# Patient Record
Sex: Male | Born: 1940 | Race: White | Hispanic: No | Marital: Married | Smoking: Former smoker
Health system: Southern US, Community
[De-identification: ages and names within clinical notes are randomized; demographics above are authoritative.]

## PROBLEM LIST (undated history)

## (undated) DIAGNOSIS — E785 Hyperlipidemia, unspecified: Secondary | ICD-10-CM

## (undated) DIAGNOSIS — G4730 Sleep apnea, unspecified: Secondary | ICD-10-CM

## (undated) DIAGNOSIS — M1990 Unspecified osteoarthritis, unspecified site: Secondary | ICD-10-CM

## (undated) DIAGNOSIS — I5189 Other ill-defined heart diseases: Secondary | ICD-10-CM

## (undated) DIAGNOSIS — I358 Other nonrheumatic aortic valve disorders: Secondary | ICD-10-CM

## (undated) DIAGNOSIS — N200 Calculus of kidney: Secondary | ICD-10-CM

## (undated) DIAGNOSIS — J449 Chronic obstructive pulmonary disease, unspecified: Secondary | ICD-10-CM

## (undated) DIAGNOSIS — I10 Essential (primary) hypertension: Secondary | ICD-10-CM

## (undated) HISTORY — PX: HAMMER TOE SURGERY: SHX385

## (undated) HISTORY — PX: CARPAL TUNNEL RELEASE: SHX101

## (undated) HISTORY — DX: Other ill-defined heart diseases: I51.89

## (undated) HISTORY — DX: Essential (primary) hypertension: I10

## (undated) HISTORY — PX: COLONOSCOPY W/ BIOPSIES AND POLYPECTOMY: SHX1376

## (undated) HISTORY — DX: Hyperlipidemia, unspecified: E78.5

## (undated) HISTORY — PX: MULTIPLE TOOTH EXTRACTIONS: SHX2053

## (undated) HISTORY — DX: Other nonrheumatic aortic valve disorders: I35.8

## (undated) HISTORY — PX: ROTATOR CUFF REPAIR: SHX139

## (undated) HISTORY — PX: CATARACT EXTRACTION: SUR2

## (undated) HISTORY — PX: KNEE ARTHROSCOPY W/ MENISCAL REPAIR: SHX1877

## (undated) HISTORY — PX: JOINT REPLACEMENT: SHX530

## (undated) SURGERY — EGD (ESOPHAGOGASTRODUODENOSCOPY)
Anesthesia: Moderate Sedation

---

## 1992-01-13 HISTORY — PX: REPLACEMENT TOTAL KNEE: SUR1224

## 2000-02-11 ENCOUNTER — Encounter: Admission: RE | Admit: 2000-02-11 | Discharge: 2000-02-11 | Payer: Self-pay | Admitting: Orthopedic Surgery

## 2000-05-04 ENCOUNTER — Inpatient Hospital Stay (HOSPITAL_COMMUNITY): Admission: RE | Admit: 2000-05-04 | Discharge: 2000-05-05 | Payer: Self-pay | Admitting: Orthopedic Surgery

## 2001-07-25 ENCOUNTER — Encounter: Payer: Self-pay | Admitting: Orthopedic Surgery

## 2001-07-27 ENCOUNTER — Inpatient Hospital Stay (HOSPITAL_COMMUNITY): Admission: RE | Admit: 2001-07-27 | Discharge: 2001-07-30 | Payer: Self-pay | Admitting: Orthopedic Surgery

## 2006-03-29 ENCOUNTER — Ambulatory Visit (HOSPITAL_COMMUNITY): Admission: RE | Admit: 2006-03-29 | Discharge: 2006-03-29 | Payer: Self-pay | Admitting: General Surgery

## 2008-11-14 HISTORY — PX: NM MYOCAR PERF WALL MOTION: HXRAD629

## 2010-01-23 HISTORY — PX: US ECHOCARDIOGRAPHY: HXRAD669

## 2014-07-27 ENCOUNTER — Encounter: Payer: Self-pay | Admitting: *Deleted

## 2014-08-02 ENCOUNTER — Encounter: Payer: Self-pay | Admitting: Cardiovascular Disease

## 2015-01-17 DIAGNOSIS — M170 Bilateral primary osteoarthritis of knee: Secondary | ICD-10-CM | POA: Diagnosis not present

## 2015-01-22 ENCOUNTER — Ambulatory Visit: Payer: Self-pay | Admitting: Physician Assistant

## 2015-01-22 NOTE — H&P (Signed)
TOTAL KNEE ADMISSION H&P  Patient is being admitted for right total knee arthroplasty.  Subjective:  Chief Complaint:right knee pain.  HPI: Mark Solomon, 75 y.o. male, has a history of pain and functional disability in the right knee due to arthritis and has failed non-surgical conservative treatments for greater than 12 weeks to includeNSAID's and/or analgesics, corticosteriod injections and activity modification.  Onset of symptoms was gradual, starting 10 years ago with gradually worsening course since that time. The patient noted prior procedures on the knee to include  arthroscopy and menisectomy on the right knee(s).  Patient currently rates pain in the right knee(s) at 8 out of 10 with activity. Patient has night pain, worsening of pain with activity and weight bearing, pain that interferes with activities of daily living, pain with passive range of motion, crepitus and joint swelling.  Patient has evidence of periarticular osteophytes and joint space narrowing by imaging studies.There is no active infection.  There are no active problems to display for this patient.  Past Medical History  Diagnosis Date  . Hypertension   . Hyperlipidemia   . Diastolic dysfunction   . Aortic valve sclerosis     Past Surgical History  Procedure Laterality Date  . Rotator cuff repair    . Replacement total knee  1994    left  . Knee arthroscopy w/ meniscal repair      right   . US echocardiography  01/23/10    AOV appears sclerotic, borderline LA enlargement  . Nm myocar perf wall motion  11/14/2008    Normal     (Not in a hospital admission) Allergies no known allergies  Social History  Substance Use Topics  . Smoking status: Former Smoker    Quit date: 01/13/2008  . Smokeless tobacco: Never Used  . Alcohol Use: 0.0 oz/week    0 Standard drinks or equivalent per week    Family History  Problem Relation Age of Onset  . Diabetes Mother   . Heart attack Mother   . Heart attack  Father      Review of Systems  Musculoskeletal: Positive for joint pain.  All other systems reviewed and are negative.   Objective:  Physical Exam  Constitutional: He is oriented to person, place, and time. He appears well-developed and well-nourished. No distress.  HENT:  Head: Normocephalic and atraumatic.  Nose: Nose normal.  Eyes: Conjunctivae and EOM are normal. Pupils are equal, round, and reactive to light.  Neck: Normal range of motion. Neck supple.  Cardiovascular: Normal rate, regular rhythm, normal heart sounds and intact distal pulses.   Respiratory: Breath sounds normal. No respiratory distress. He has no wheezes.  GI: Soft. Bowel sounds are normal. He exhibits no distension. There is no tenderness.  Musculoskeletal:       Right knee: He exhibits swelling. He exhibits normal range of motion, no erythema, no LCL laxity and no MCL laxity. Tenderness found.  Lymphadenopathy:    He has no cervical adenopathy.  Neurological: He is alert and oriented to person, place, and time. No cranial nerve deficit.  Skin: Skin is warm and dry. No rash noted. No erythema.  Psychiatric: He has a normal mood and affect. His behavior is normal.    Vital signs in last 24 hours: @  Labs:   There is no height or weight on file to calculate BMI.   Imaging Review Plain radiographs demonstrate moderate degenerative joint disease of the right knee(s). The overall alignment issignificant varus. The bone  quality appears to be good for age and reported activity level.  Assessment/Plan:  End stage arthritis, right knee   The patient history, physical examination, clinical judgment of the provider and imaging studies are consistent with end stage degenerative joint disease of the right knee(s) and total knee arthroplasty is deemed medically necessary. The treatment options including medical management, injection therapy arthroscopy and arthroplasty were discussed at length. The risks  and benefits of total knee arthroplasty were presented and reviewed. The risks due to aseptic loosening, infection, stiffness, patella tracking problems, thromboembolic complications and other imponderables were discussed. The patient acknowledged the explanation, agreed to proceed with the plan and consent was signed. Patient is being admitted for inpatient treatment for surgery, pain control, PT, OT, prophylactic antibiotics, VTE prophylaxis, progressive ambulation and ADL's and discharge planning. The patient is planning to be discharged home with home health services

## 2015-01-28 ENCOUNTER — Encounter (HOSPITAL_COMMUNITY): Payer: Self-pay

## 2015-01-28 ENCOUNTER — Encounter (HOSPITAL_COMMUNITY)
Admission: RE | Admit: 2015-01-28 | Discharge: 2015-01-28 | Disposition: A | Discharge status: Home / Self Care | Payer: Medicare Other | Source: Ambulatory Visit | Attending: Orthopedic Surgery | Admitting: Orthopedic Surgery

## 2015-01-28 ENCOUNTER — Encounter (HOSPITAL_COMMUNITY)
Admission: RE | Admit: 2015-01-28 | Discharge: 2015-01-28 | Disposition: A | Discharge status: Home / Self Care | Payer: Medicare Other | Source: Ambulatory Visit | Attending: Physician Assistant | Admitting: Physician Assistant

## 2015-01-28 DIAGNOSIS — Z01812 Encounter for preprocedural laboratory examination: Secondary | ICD-10-CM | POA: Insufficient documentation

## 2015-01-28 DIAGNOSIS — Z01818 Encounter for other preprocedural examination: Secondary | ICD-10-CM | POA: Insufficient documentation

## 2015-01-28 DIAGNOSIS — J449 Chronic obstructive pulmonary disease, unspecified: Secondary | ICD-10-CM | POA: Insufficient documentation

## 2015-01-28 DIAGNOSIS — Z87891 Personal history of nicotine dependence: Secondary | ICD-10-CM | POA: Diagnosis not present

## 2015-01-28 DIAGNOSIS — E785 Hyperlipidemia, unspecified: Secondary | ICD-10-CM | POA: Diagnosis not present

## 2015-01-28 DIAGNOSIS — Z7982 Long term (current) use of aspirin: Secondary | ICD-10-CM | POA: Diagnosis not present

## 2015-01-28 DIAGNOSIS — M1711 Unilateral primary osteoarthritis, right knee: Secondary | ICD-10-CM

## 2015-01-28 DIAGNOSIS — Z96659 Presence of unspecified artificial knee joint: Secondary | ICD-10-CM | POA: Diagnosis not present

## 2015-01-28 DIAGNOSIS — Z471 Aftercare following joint replacement surgery: Secondary | ICD-10-CM | POA: Diagnosis not present

## 2015-01-28 DIAGNOSIS — I10 Essential (primary) hypertension: Secondary | ICD-10-CM | POA: Insufficient documentation

## 2015-01-28 DIAGNOSIS — Z79899 Other long term (current) drug therapy: Secondary | ICD-10-CM | POA: Insufficient documentation

## 2015-01-28 DIAGNOSIS — Z0183 Encounter for blood typing: Secondary | ICD-10-CM | POA: Diagnosis not present

## 2015-01-28 DIAGNOSIS — I440 Atrioventricular block, first degree: Secondary | ICD-10-CM | POA: Diagnosis not present

## 2015-01-28 HISTORY — DX: Calculus of kidney: N20.0

## 2015-01-28 HISTORY — DX: Chronic obstructive pulmonary disease, unspecified: J44.9

## 2015-01-28 HISTORY — DX: Unspecified osteoarthritis, unspecified site: M19.90

## 2015-01-28 LAB — CBC WITH DIFFERENTIAL/PLATELET
BASOS PCT: 0 %
Basophils Absolute: 0 10*3/uL (ref 0.0–0.1)
EOS ABS: 0.4 10*3/uL (ref 0.0–0.7)
EOS PCT: 6 %
HCT: 46.8 % (ref 39.0–52.0)
Hemoglobin: 16.3 g/dL (ref 13.0–17.0)
LYMPHS ABS: 1.2 10*3/uL (ref 0.7–4.0)
Lymphocytes Relative: 21 %
MCH: 33.3 pg (ref 26.0–34.0)
MCHC: 34.8 g/dL (ref 30.0–36.0)
MCV: 95.5 fL (ref 78.0–100.0)
MONO ABS: 0.4 10*3/uL (ref 0.1–1.0)
MONOS PCT: 6 %
Neutro Abs: 3.9 10*3/uL (ref 1.7–7.7)
Neutrophils Relative %: 67 %
PLATELETS: 120 10*3/uL — AB (ref 150–400)
RBC: 4.9 MIL/uL (ref 4.22–5.81)
RDW: 13.3 % (ref 11.5–15.5)
WBC: 5.9 10*3/uL (ref 4.0–10.5)

## 2015-01-28 LAB — PROTIME-INR
INR: 1.1 (ref 0.00–1.49)
Prothrombin Time: 14.4 seconds (ref 11.6–15.2)

## 2015-01-28 LAB — SURGICAL PCR SCREEN
MRSA, PCR: NEGATIVE
Staphylococcus aureus: POSITIVE — AB

## 2015-01-28 LAB — URINALYSIS, ROUTINE W REFLEX MICROSCOPIC
GLUCOSE, UA: NEGATIVE mg/dL
HGB URINE DIPSTICK: NEGATIVE
Ketones, ur: 40 mg/dL — AB
Nitrite: NEGATIVE
PROTEIN: NEGATIVE mg/dL
Specific Gravity, Urine: 1.022 (ref 1.005–1.030)
pH: 6 (ref 5.0–8.0)

## 2015-01-28 LAB — COMPREHENSIVE METABOLIC PANEL
ALBUMIN: 4.1 g/dL (ref 3.5–5.0)
ALK PHOS: 58 U/L (ref 38–126)
ALT: 24 U/L (ref 17–63)
ANION GAP: 12 (ref 5–15)
AST: 33 U/L (ref 15–41)
BUN: 14 mg/dL (ref 6–20)
CALCIUM: 9.3 mg/dL (ref 8.9–10.3)
CHLORIDE: 107 mmol/L (ref 101–111)
CO2: 25 mmol/L (ref 22–32)
Creatinine, Ser: 0.84 mg/dL (ref 0.61–1.24)
GFR calc non Af Amer: >60 mL/min (ref >60)
Glucose, Bld: 91 mg/dL (ref 65–99)
POTASSIUM: 3.7 mmol/L (ref 3.5–5.1)
SODIUM: 144 mmol/L (ref 135–145)
Total Bilirubin: 0.6 mg/dL (ref 0.3–1.2)
Total Protein: 6.5 g/dL (ref 6.5–8.1)

## 2015-01-28 LAB — TYPE AND SCREEN
ABO/RH(D): O NEG
ANTIBODY SCREEN: NEGATIVE

## 2015-01-28 LAB — APTT: aPTT: 29 seconds (ref 24–37)

## 2015-01-28 LAB — URINE MICROSCOPIC-ADD ON

## 2015-01-28 LAB — ABO/RH: ABO/RH(D): O NEG

## 2015-01-28 NOTE — Progress Notes (Signed)
Patient has had an ECHO done 01/23/10 and Stress test 11/14/08 with Dr. Tresa EndoKelly.  He does not remember why he had either of these done but states he has never had to follow-up with a cardiologist regularly and as far as he knows the tests were normal. Denies ever having a heart cath.   EKG done in PAT 01/28/15.  PCP is Selinda FlavinHOWARD, KEVIN, MD

## 2015-01-28 NOTE — Pre-Procedure Instructions (Addendum)
Mark Solomon  01/28/2015     No Pharmacies Listed   Your procedure is scheduled on Friday February 08 2015 at 1030 am.  Report to [facility] Admitting at 830 A.M.  Call this number if you have problems the morning of surgery:  [phone]   Remember:  Do not eat food or drink liquids after midnight Thursday January 26th.  Take these medicines the morning of surgery with A SIP OF WATER flonase spray (as needed), QVAR inhaler as needed (bring with you), tamsulosin (flomax) if needed  STOP: ALL Vitamins, Supplements, Effient and Herbal Medications, Fish Oils, Aspirins, NSAIDs (Nonsteroidal Anti-inflammatories such as Ibuprofen, Aleve, or Advil), and Goody's/BC Powders 7 days prior to surgery, until after surgery as directed by your physician.    Do not wear jewelry, make-up or nail polish.  Do not wear lotions, powders, or perfumes.  You may wear deodorant.  Do not shave 48 hours prior to surgery.  Men may shave face and neck.  Do not bring valuables to the hospital.  [facility] is not responsible for any belongings or valuables.  Contacts, dentures or bridgework may not be worn into surgery.  Leave your suitcase in the car.  After surgery it may be brought to your room.  For patients admitted to the hospital, discharge time will be determined by your treatment team.  Patients discharged the day of surgery will not be allowed to drive home.    Please read over the following fact sheets that you were given. Pain Booklet, Coughing and Deep Breathing, Blood Transfusion Information, Total Joint Packet, MRSA Information and Surgical Site Infection Prevention        Preparing for Surgery at [facility]  Before surgery, you can play an important role.  Because skin is not sterile, your skin needs to be as free of germs as possible.  You can reduce the number of germs on your skin by washing with CHG (chlorahexidine gluconate) Soap before surgery.  CHG is an antiseptic  cleaner with kills germs and bonds with the skin to continue killing germs even after washing.   Please do not use if you have an allergy to CHG or antibacterial soaps.  If your skin becomes reddened/irritated stop using the CHG.  Do not shave (including legs and underarms) for at least 48 hours prior to first CHG shower.  It is okay to shave your face.  Please follow these instructions carefully:  1. Shower with CHG Soap the night before surgery and the morning of Surgery. 2. If you choose to wash your hair, wash your hair first as usual with your normal shampoo. 3. After you shampoo, rinse your hair and body thoroughly to remove the Shampoo. 4. Use CHG as you would any other liquid soap. You can apply chg directly to the skin and wash gently with scrungie or a clean washcloth. 5. Apply the CHG Soap to your body ONLY FROM THE NECK DOWN. Do not use on open wounds or open sores. Avoid contact with your eyes, ears, mouth and genitals (private parts). Wash genitals (private parts) with your normal soap. 6. Wash thoroughly, paying special attention to the area where your surgery will be performed. 7. Thoroughly rinse your body with warm water from the neck down. 8. DO NOT shower/wash with your normal soap after using and rinsing off the CHG Soap. 9. Pat yourself dry with a clean towel.  10. Wear clean pajamas.  11. Place clean sheets on  your bed the night of your first shower and do not sleep with pets.  Day of Surgery  Do not apply any lotions/deodorants the morning of surgery. Please wear clean clothes to the hospital/surgery center.

## 2015-01-29 LAB — URINE CULTURE

## 2015-01-29 NOTE — Progress Notes (Signed)
Anesthesia Chart Review:  Pt is 75 year old male scheduled for R total knee arthroplasty on 02/08/2015 with Dr. Madelon Lips.   PMH includes:  CAD (I find no evidence of this diagnosis), HTN, hyperlipidemia, COPD. Former smoker. BMI 30.   Medications include: ASA, qvar, vytorin, losartan.   Preoperative labs reviewed.    Chest x-ray 01/28/15 reviewed. No active cardiopulmonary disease.   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 Grade I diastolic dysfunction.  - Mild mitral annular calcification. Mild MR. No MVP.  - Trace TR - Aortic valve appears to be mildly sclerotic. No aortic stenosis.  - Borderline LAE  Nuclear stress test 11/14/08:  - Normal myocardial perfusion imaging. EF 85%. No significant ischemia demonstrated. This is a low risk scan.   If no changes, I anticipate pt can proceed with surgery as scheduled.   Rica Mast, FNP-BC [facility]/Anesthesiology Phone: [phone] 01/29/2015 3:33 PM

## 2015-01-31 ENCOUNTER — Encounter (HOSPITAL_COMMUNITY): Payer: Self-pay

## 2015-02-07 DIAGNOSIS — Z96651 Presence of right artificial knee joint: Secondary | ICD-10-CM | POA: Diagnosis not present

## 2015-02-07 DIAGNOSIS — M1711 Unilateral primary osteoarthritis, right knee: Secondary | ICD-10-CM | POA: Diagnosis not present

## 2015-02-07 MED ORDER — SODIUM CHLORIDE 0.9 % IV SOLN: INTRAVENOUS | Status: DC

## 2015-02-07 MED ORDER — CHLORHEXIDINE GLUCONATE 4 % EX LIQD: 60.0000 mL | Freq: Once | CUTANEOUS | Status: DC

## 2015-02-07 MED ORDER — CEFAZOLIN SODIUM-DEXTROSE 2-3 GM-% IV SOLR
2.0000 g | INTRAVENOUS | Status: AC
  Administered 2015-02-08: 2 g via INTRAVENOUS
  Filled 2015-02-07: qty 50

## 2015-02-07 MED ORDER — SODIUM CHLORIDE 0.9 % IV SOLN
1000.0000 mg | INTRAVENOUS | Status: DC
  Filled 2015-02-07: qty 10

## 2015-02-08 ENCOUNTER — Inpatient Hospital Stay (HOSPITAL_COMMUNITY)
Admission: RE | Admit: 2015-02-08 | Discharge: 2015-02-11 | DRG: 470 | Disposition: A | Discharge status: Home Health | Payer: Medicare Other | Source: Ambulatory Visit | Attending: Orthopedic Surgery | Admitting: Orthopedic Surgery

## 2015-02-08 ENCOUNTER — Encounter (HOSPITAL_COMMUNITY)
Admission: RE | Disposition: A | Discharge status: Home Health | Payer: Self-pay | Source: Ambulatory Visit | Attending: Orthopedic Surgery

## 2015-02-08 ENCOUNTER — Inpatient Hospital Stay (HOSPITAL_COMMUNITY): Payer: Medicare Other | Admitting: Anesthesiology

## 2015-02-08 ENCOUNTER — Encounter (HOSPITAL_COMMUNITY): Payer: Self-pay | Admitting: *Deleted

## 2015-02-08 ENCOUNTER — Inpatient Hospital Stay (HOSPITAL_COMMUNITY): Payer: Medicare Other | Admitting: Emergency Medicine

## 2015-02-08 DIAGNOSIS — Z79899 Other long term (current) drug therapy: Secondary | ICD-10-CM

## 2015-02-08 DIAGNOSIS — I10 Essential (primary) hypertension: Secondary | ICD-10-CM | POA: Diagnosis not present

## 2015-02-08 DIAGNOSIS — J449 Chronic obstructive pulmonary disease, unspecified: Secondary | ICD-10-CM | POA: Diagnosis not present

## 2015-02-08 DIAGNOSIS — E785 Hyperlipidemia, unspecified: Secondary | ICD-10-CM | POA: Diagnosis present

## 2015-02-08 DIAGNOSIS — D62 Acute posthemorrhagic anemia: Secondary | ICD-10-CM | POA: Diagnosis not present

## 2015-02-08 DIAGNOSIS — Z87891 Personal history of nicotine dependence: Secondary | ICD-10-CM

## 2015-02-08 DIAGNOSIS — M179 Osteoarthritis of knee, unspecified: Secondary | ICD-10-CM | POA: Diagnosis not present

## 2015-02-08 DIAGNOSIS — G8918 Other acute postprocedural pain: Secondary | ICD-10-CM | POA: Diagnosis not present

## 2015-02-08 DIAGNOSIS — M1711 Unilateral primary osteoarthritis, right knee: Secondary | ICD-10-CM | POA: Diagnosis not present

## 2015-02-08 DIAGNOSIS — M25561 Pain in right knee: Secondary | ICD-10-CM | POA: Diagnosis present

## 2015-02-08 DIAGNOSIS — Z7901 Long term (current) use of anticoagulants: Secondary | ICD-10-CM | POA: Diagnosis not present

## 2015-02-08 DIAGNOSIS — I358 Other nonrheumatic aortic valve disorders: Secondary | ICD-10-CM | POA: Diagnosis not present

## 2015-02-08 HISTORY — PX: TOTAL KNEE ARTHROPLASTY: SHX125

## 2015-02-08 LAB — CBC
HEMATOCRIT: 41.4 % (ref 39.0–52.0)
Hemoglobin: 14 g/dL (ref 13.0–17.0)
MCH: 32.3 pg (ref 26.0–34.0)
MCHC: 33.8 g/dL (ref 30.0–36.0)
MCV: 95.6 fL (ref 78.0–100.0)
Platelets: 121 10*3/uL — ABNORMAL LOW (ref 150–400)
RBC: 4.33 MIL/uL (ref 4.22–5.81)
RDW: 13.3 % (ref 11.5–15.5)
WBC: 7.3 10*3/uL (ref 4.0–10.5)

## 2015-02-08 LAB — CREATININE, SERUM: Creatinine, Ser: 0.79 mg/dL (ref 0.61–1.24)

## 2015-02-08 SURGERY — ARTHROPLASTY, KNEE, TOTAL
Anesthesia: Regional | Site: Knee | Laterality: Right

## 2015-02-08 MED ORDER — EZETIMIBE-SIMVASTATIN 10-40 MG PO TABS
1.0000 | ORAL_TABLET | Freq: Every day | ORAL | Status: DC
  Administered 2015-02-08 – 2015-02-10 (×3): 1 via ORAL
  Filled 2015-02-08 (×5): qty 1

## 2015-02-08 MED ORDER — OXYCODONE-ACETAMINOPHEN 5-325 MG PO TABS: 1.0000 | ORAL_TABLET | ORAL | Status: DC | PRN

## 2015-02-08 MED ORDER — ROCURONIUM BROMIDE 50 MG/5ML IV SOLN
INTRAVENOUS | Status: AC
  Filled 2015-02-08: qty 1

## 2015-02-08 MED ORDER — FENTANYL CITRATE (PF) 100 MCG/2ML IJ SOLN
INTRAMUSCULAR | Status: DC | PRN
  Administered 2015-02-08 (×5): 50 ug via INTRAVENOUS

## 2015-02-08 MED ORDER — ALUM & MAG HYDROXIDE-SIMETH 200-200-20 MG/5ML PO SUSP: 30.0000 mL | ORAL | Status: DC | PRN

## 2015-02-08 MED ORDER — PROMETHAZINE HCL 25 MG/ML IJ SOLN
6.2500 mg | INTRAMUSCULAR | Status: DC | PRN
Start: 2015-02-08 — End: 2015-02-08

## 2015-02-08 MED ORDER — OXYCODONE HCL 5 MG PO TABS
5.0000 mg | ORAL_TABLET | ORAL | Status: DC | PRN
  Administered 2015-02-08 – 2015-02-10 (×7): 10 mg via ORAL
  Filled 2015-02-08 (×7): qty 2

## 2015-02-08 MED ORDER — POLYETHYLENE GLYCOL 3350 17 G PO PACK: 17.0000 g | PACK | Freq: Every day | ORAL | Status: DC | PRN

## 2015-02-08 MED ORDER — BUPIVACAINE-EPINEPHRINE (PF) 0.5% -1:200000 IJ SOLN
INTRAMUSCULAR | Status: DC | PRN
  Administered 2015-02-08: 30 mL via PERINEURAL

## 2015-02-08 MED ORDER — METOCLOPRAMIDE HCL 5 MG/ML IJ SOLN: 5.0000 mg | Freq: Three times a day (TID) | INTRAMUSCULAR | Status: DC | PRN

## 2015-02-08 MED ORDER — PROPOFOL 10 MG/ML IV BOLUS
INTRAVENOUS | Status: AC
  Filled 2015-02-08: qty 20

## 2015-02-08 MED ORDER — MEPERIDINE HCL 25 MG/ML IJ SOLN: 6.2500 mg | INTRAMUSCULAR | Status: DC | PRN

## 2015-02-08 MED ORDER — DOCUSATE SODIUM 100 MG PO CAPS
100.0000 mg | ORAL_CAPSULE | Freq: Two times a day (BID) | ORAL | Status: DC
  Administered 2015-02-08 – 2015-02-11 (×6): 100 mg via ORAL
  Filled 2015-02-08 (×6): qty 1

## 2015-02-08 MED ORDER — ONDANSETRON HCL 4 MG PO TABS: 4.0000 mg | ORAL_TABLET | Freq: Four times a day (QID) | ORAL | Status: DC | PRN

## 2015-02-08 MED ORDER — LIDOCAINE HCL (CARDIAC) 20 MG/ML IV SOLN
INTRAVENOUS | Status: DC | PRN
  Administered 2015-02-08: 100 mg via INTRAVENOUS

## 2015-02-08 MED ORDER — MIDAZOLAM HCL 2 MG/2ML IJ SOLN
INTRAMUSCULAR | Status: AC
  Filled 2015-02-08: qty 2

## 2015-02-08 MED ORDER — PROPOFOL 10 MG/ML IV BOLUS: INTRAVENOUS | Status: DC | PRN

## 2015-02-08 MED ORDER — ENOXAPARIN SODIUM 30 MG/0.3ML ~~LOC~~ SOLN: 30.0000 mg | Freq: Two times a day (BID) | SUBCUTANEOUS | Status: DC

## 2015-02-08 MED ORDER — ONDANSETRON HCL 4 MG/2ML IJ SOLN
INTRAMUSCULAR | Status: AC
  Filled 2015-02-08: qty 2

## 2015-02-08 MED ORDER — APIXABAN 2.5 MG PO TABS
2.5000 mg | ORAL_TABLET | Freq: Two times a day (BID) | ORAL | Status: DC
  Administered 2015-02-09 – 2015-02-11 (×5): 2.5 mg via ORAL
  Filled 2015-02-08 (×5): qty 1

## 2015-02-08 MED ORDER — LACTATED RINGERS IV SOLN
INTRAVENOUS | Status: DC
  Administered 2015-02-08 (×3): via INTRAVENOUS

## 2015-02-08 MED ORDER — METOCLOPRAMIDE HCL 5 MG PO TABS: 5.0000 mg | ORAL_TABLET | Freq: Three times a day (TID) | ORAL | Status: DC | PRN

## 2015-02-08 MED ORDER — ONDANSETRON HCL 4 MG/2ML IJ SOLN
INTRAMUSCULAR | Status: DC | PRN
  Administered 2015-02-08: 4 mg via INTRAVENOUS

## 2015-02-08 MED ORDER — SODIUM CHLORIDE 0.9 % IJ SOLN
INTRAMUSCULAR | Status: AC
  Filled 2015-02-08: qty 10

## 2015-02-08 MED ORDER — SODIUM CHLORIDE 0.9 % IR SOLN
Status: DC | PRN
  Administered 2015-02-08: 1000 mL

## 2015-02-08 MED ORDER — EPHEDRINE SULFATE 50 MG/ML IJ SOLN
INTRAMUSCULAR | Status: DC | PRN
  Administered 2015-02-08: 10 mg via INTRAVENOUS
  Administered 2015-02-08 (×2): 5 mg via INTRAVENOUS
  Administered 2015-02-08: 20 mg via INTRAVENOUS
  Administered 2015-02-08: 5 mg via INTRAVENOUS

## 2015-02-08 MED ORDER — FENTANYL CITRATE (PF) 100 MCG/2ML IJ SOLN
INTRAMUSCULAR | Status: AC
  Filled 2015-02-08: qty 2

## 2015-02-08 MED ORDER — FENTANYL CITRATE (PF) 250 MCG/5ML IJ SOLN
INTRAMUSCULAR | Status: AC
  Filled 2015-02-08: qty 5

## 2015-02-08 MED ORDER — PROPOFOL 10 MG/ML IV BOLUS
INTRAVENOUS | Status: DC | PRN
  Administered 2015-02-08: 200 mg via INTRAVENOUS

## 2015-02-08 MED ORDER — HYDROMORPHONE HCL 1 MG/ML IJ SOLN
1.0000 mg | INTRAMUSCULAR | Status: DC | PRN
  Administered 2015-02-08 – 2015-02-10 (×6): 1 mg via INTRAVENOUS
  Filled 2015-02-08 (×6): qty 1

## 2015-02-08 MED ORDER — SODIUM CHLORIDE 0.9 % IV SOLN
INTRAVENOUS | Status: DC
  Administered 2015-02-08: 18:00:00 via INTRAVENOUS

## 2015-02-08 MED ORDER — CEFAZOLIN SODIUM 1-5 GM-% IV SOLN
1.0000 g | Freq: Four times a day (QID) | INTRAVENOUS | Status: AC
  Administered 2015-02-08 – 2015-02-09 (×2): 1 g via INTRAVENOUS
  Filled 2015-02-08 (×2): qty 50

## 2015-02-08 MED ORDER — PHENYLEPHRINE HCL 10 MG/ML IJ SOLN
INTRAMUSCULAR | Status: DC | PRN
  Administered 2015-02-08 (×4): 40 ug via INTRAVENOUS
  Administered 2015-02-08: 80 ug via INTRAVENOUS
  Administered 2015-02-08: 40 ug via INTRAVENOUS
  Administered 2015-02-08: 120 ug via INTRAVENOUS

## 2015-02-08 MED ORDER — ARTIFICIAL TEARS OP OINT
TOPICAL_OINTMENT | OPHTHALMIC | Status: DC | PRN
  Administered 2015-02-08: 1 via OPHTHALMIC

## 2015-02-08 MED ORDER — LOSARTAN POTASSIUM 50 MG PO TABS
50.0000 mg | ORAL_TABLET | Freq: Every day | ORAL | Status: DC
  Administered 2015-02-08 – 2015-02-11 (×4): 50 mg via ORAL
  Filled 2015-02-08 (×4): qty 1

## 2015-02-08 MED ORDER — MIDAZOLAM HCL 2 MG/2ML IJ SOLN
1.0000 mg | Freq: Once | INTRAMUSCULAR | Status: AC
  Administered 2015-02-08: 1 mg via INTRAVENOUS

## 2015-02-08 MED ORDER — HYDROMORPHONE HCL 1 MG/ML IJ SOLN
INTRAMUSCULAR | Status: AC
  Filled 2015-02-08: qty 1

## 2015-02-08 MED ORDER — FENTANYL CITRATE (PF) 100 MCG/2ML IJ SOLN
50.0000 ug | Freq: Once | INTRAMUSCULAR | Status: AC
  Administered 2015-02-08: 50 ug via INTRAVENOUS

## 2015-02-08 MED ORDER — GLYCOPYRROLATE 0.2 MG/ML IJ SOLN
INTRAMUSCULAR | Status: DC | PRN
  Administered 2015-02-08: .6 mg via INTRAVENOUS

## 2015-02-08 MED ORDER — ADULT MULTIVITAMIN W/MINERALS CH
1.0000 | ORAL_TABLET | Freq: Every day | ORAL | Status: DC
  Administered 2015-02-09 – 2015-02-11 (×3): 1 via ORAL
  Filled 2015-02-08 (×3): qty 1

## 2015-02-08 MED ORDER — PHENOL 1.4 % MT LIQD: 1.0000 | OROMUCOSAL | Status: DC | PRN

## 2015-02-08 MED ORDER — FLEET ENEMA 7-19 GM/118ML RE ENEM
1.0000 | ENEMA | Freq: Once | RECTAL | Status: DC | PRN
Start: 2015-02-08 — End: 2015-02-11

## 2015-02-08 MED ORDER — EPHEDRINE SULFATE 50 MG/ML IJ SOLN
INTRAMUSCULAR | Status: AC
  Filled 2015-02-08: qty 1

## 2015-02-08 MED ORDER — ACETAMINOPHEN 650 MG RE SUPP: 650.0000 mg | Freq: Four times a day (QID) | RECTAL | Status: DC | PRN

## 2015-02-08 MED ORDER — TAMSULOSIN HCL 0.4 MG PO CAPS
0.4000 mg | ORAL_CAPSULE | Freq: Every day | ORAL | Status: DC
  Administered 2015-02-08 – 2015-02-11 (×4): 0.4 mg via ORAL
  Filled 2015-02-08 (×4): qty 1

## 2015-02-08 MED ORDER — ARTIFICIAL TEARS OP OINT
TOPICAL_OINTMENT | OPHTHALMIC | Status: AC
  Filled 2015-02-08: qty 3.5

## 2015-02-08 MED ORDER — APIXABAN 2.5 MG PO TABS: 2.5000 mg | ORAL_TABLET | Freq: Two times a day (BID) | ORAL | Status: DC

## 2015-02-08 MED ORDER — LIDOCAINE HCL (CARDIAC) 20 MG/ML IV SOLN
INTRAVENOUS | Status: AC
  Filled 2015-02-08: qty 5

## 2015-02-08 MED ORDER — BUDESONIDE 0.25 MG/2ML IN SUSP: 0.2500 mg | Freq: Every day | RESPIRATORY_TRACT | Status: DC | PRN

## 2015-02-08 MED ORDER — DIPHENHYDRAMINE HCL 12.5 MG/5ML PO ELIX: 12.5000 mg | ORAL_SOLUTION | ORAL | Status: DC | PRN

## 2015-02-08 MED ORDER — ACETAMINOPHEN 325 MG PO TABS: 650.0000 mg | ORAL_TABLET | Freq: Four times a day (QID) | ORAL | Status: DC | PRN

## 2015-02-08 MED ORDER — SUCCINYLCHOLINE CHLORIDE 20 MG/ML IJ SOLN
INTRAMUSCULAR | Status: DC | PRN
  Administered 2015-02-08: 100 mg via INTRAVENOUS

## 2015-02-08 MED ORDER — GLYCOPYRROLATE 0.2 MG/ML IJ SOLN
INTRAMUSCULAR | Status: AC
  Filled 2015-02-08: qty 1

## 2015-02-08 MED ORDER — ONDANSETRON HCL 4 MG/2ML IJ SOLN: 4.0000 mg | Freq: Four times a day (QID) | INTRAMUSCULAR | Status: DC | PRN

## 2015-02-08 MED ORDER — MENTHOL 3 MG MT LOZG: 1.0000 | LOZENGE | OROMUCOSAL | Status: DC | PRN

## 2015-02-08 MED ORDER — SORBITOL 70 % SOLN
30.0000 mL | Freq: Every day | Status: DC | PRN
Start: 2015-02-08 — End: 2015-02-11

## 2015-02-08 MED ORDER — ROCURONIUM BROMIDE 100 MG/10ML IV SOLN
INTRAVENOUS | Status: DC | PRN
  Administered 2015-02-08: 50 mg via INTRAVENOUS

## 2015-02-08 MED ORDER — SUCCINYLCHOLINE CHLORIDE 20 MG/ML IJ SOLN
INTRAMUSCULAR | Status: AC
  Filled 2015-02-08: qty 1

## 2015-02-08 MED ORDER — HYDROMORPHONE HCL 1 MG/ML IJ SOLN
0.2500 mg | INTRAMUSCULAR | Status: DC | PRN
  Administered 2015-02-08: 0.5 mg via INTRAVENOUS

## 2015-02-08 MED ORDER — NEOSTIGMINE METHYLSULFATE 10 MG/10ML IV SOLN
INTRAVENOUS | Status: DC | PRN
  Administered 2015-02-08: 4 mg via INTRAVENOUS

## 2015-02-08 MED ORDER — FLUTICASONE PROPIONATE 50 MCG/ACT NA SUSP
2.0000 | Freq: Every day | NASAL | Status: DC
  Administered 2015-02-09 – 2015-02-11 (×3): 2 via NASAL
  Filled 2015-02-08: qty 16

## 2015-02-08 SURGICAL SUPPLY — 72 items
BANDAGE ACE 6X5 VEL STRL LF (GAUZE/BANDAGES/DRESSINGS) ×6 IMPLANT
BANDAGE ELASTIC 4 VELCRO ST LF (GAUZE/BANDAGES/DRESSINGS) ×3 IMPLANT
BANDAGE ELASTIC 6 VELCRO ST LF (GAUZE/BANDAGES/DRESSINGS) ×3 IMPLANT
BANDAGE ESMARK 6X9 LF (GAUZE/BANDAGES/DRESSINGS) ×1 IMPLANT
BLADE SAGITTAL 25.0X1.19X90 (BLADE) ×2 IMPLANT
BLADE SAGITTAL 25.0X1.19X90MM (BLADE) ×1
BLADE SAW SAG 90X13X1.27 (BLADE) ×3 IMPLANT
BLADE SURG 10 STRL SS (BLADE) ×6 IMPLANT
BLADE SURG 15 STRL LF DISP TIS (BLADE) ×1 IMPLANT
BLADE SURG 15 STRL SS (BLADE) ×2
BNDG ESMARK 6X9 LF (GAUZE/BANDAGES/DRESSINGS) ×3
BOWL SMART MIX CTS (DISPOSABLE) ×3 IMPLANT
CAP KNEE TOTAL 3 SIGMA ×3 IMPLANT
CEMENT HV SMART SET (Cement) ×6 IMPLANT
COVER SURGICAL LIGHT HANDLE (MISCELLANEOUS) ×3 IMPLANT
CUFF TOURNIQUET SINGLE 34IN LL (TOURNIQUET CUFF) ×3 IMPLANT
CUFF TOURNIQUET SINGLE 44IN (TOURNIQUET CUFF) IMPLANT
DRAPE INCISE IOBAN 66X45 STRL (DRAPES) IMPLANT
DRAPE ORTHO SPLIT 77X108 STRL (DRAPES) ×4
DRAPE SURG ORHT 6 SPLT 77X108 (DRAPES) ×2 IMPLANT
DRAPE U-SHAPE 47X51 STRL (DRAPES) ×3 IMPLANT
DRSG ADAPTIC 3X8 NADH LF (GAUZE/BANDAGES/DRESSINGS) ×3 IMPLANT
DRSG MEPILEX BORDER 4X8 (GAUZE/BANDAGES/DRESSINGS) ×3 IMPLANT
DRSG PAD ABDOMINAL 8X10 ST (GAUZE/BANDAGES/DRESSINGS) ×12 IMPLANT
DURAPREP 26ML APPLICATOR (WOUND CARE) ×3 IMPLANT
ELECT REM PT RETURN 9FT ADLT (ELECTROSURGICAL) ×3
ELECTRODE REM PT RTRN 9FT ADLT (ELECTROSURGICAL) ×1 IMPLANT
EVACUATOR 1/8 PVC DRAIN (DRAIN) IMPLANT
FACESHIELD WRAPAROUND (MASK) ×6 IMPLANT
FLOSEAL 10ML (HEMOSTASIS) IMPLANT
GAUZE SPONGE 4X4 12PLY STRL (GAUZE/BANDAGES/DRESSINGS) ×3 IMPLANT
GLOVE BIOGEL PI IND STRL 8 (GLOVE) ×4 IMPLANT
GLOVE BIOGEL PI INDICATOR 8 (GLOVE) ×8
GLOVE ORTHO TXT STRL SZ7.5 (GLOVE) ×3 IMPLANT
GLOVE SURG ORTHO 8.0 STRL STRW (GLOVE) ×3 IMPLANT
GOWN STRL REUS W/ TWL LRG LVL3 (GOWN DISPOSABLE) ×2 IMPLANT
GOWN STRL REUS W/ TWL XL LVL3 (GOWN DISPOSABLE) ×1 IMPLANT
GOWN STRL REUS W/TWL 2XL LVL3 (GOWN DISPOSABLE) ×3 IMPLANT
GOWN STRL REUS W/TWL LRG LVL3 (GOWN DISPOSABLE) ×4
GOWN STRL REUS W/TWL XL LVL3 (GOWN DISPOSABLE) ×2
HANDPIECE INTERPULSE COAX TIP (DISPOSABLE) ×2
HOOD PEEL AWAY FACE SHEILD DIS (HOOD) ×3 IMPLANT
IMMOBILIZER KNEE 22 UNIV (SOFTGOODS) IMPLANT
KIT BASIN OR (CUSTOM PROCEDURE TRAY) ×3 IMPLANT
KIT ROOM TURNOVER OR (KITS) ×3 IMPLANT
MANIFOLD NEPTUNE II (INSTRUMENTS) ×3 IMPLANT
NEEDLE 22X1 1/2 (OR ONLY) (NEEDLE) IMPLANT
NS IRRIG 1000ML POUR BTL (IV SOLUTION) ×3 IMPLANT
PACK TOTAL JOINT (CUSTOM PROCEDURE TRAY) ×3 IMPLANT
PACK UNIVERSAL I (CUSTOM PROCEDURE TRAY) ×3 IMPLANT
PAD ABD 8X10 STRL (GAUZE/BANDAGES/DRESSINGS) ×3 IMPLANT
PAD ARMBOARD 7.5X6 YLW CONV (MISCELLANEOUS) ×6 IMPLANT
PAD CAST 4YDX4 CTTN HI CHSV (CAST SUPPLIES) ×1 IMPLANT
PADDING CAST ABS 4INX4YD NS (CAST SUPPLIES) ×2
PADDING CAST ABS COTTON 4X4 ST (CAST SUPPLIES) ×1 IMPLANT
PADDING CAST COTTON 4X4 STRL (CAST SUPPLIES) ×2
PADDING CAST COTTON 6X4 STRL (CAST SUPPLIES) ×3 IMPLANT
SET HNDPC FAN SPRY TIP SCT (DISPOSABLE) ×1 IMPLANT
SPONGE GAUZE 4X4 12PLY STER LF (GAUZE/BANDAGES/DRESSINGS) ×3 IMPLANT
STAPLER VISISTAT 35W (STAPLE) ×6 IMPLANT
SUCTION FRAZIER HANDLE 10FR (MISCELLANEOUS) ×2
SUCTION TUBE FRAZIER 10FR DISP (MISCELLANEOUS) ×1 IMPLANT
SUT ETHIBOND NAB CT1 #1 30IN (SUTURE) ×9 IMPLANT
SUT VIC AB 0 CT1 27 (SUTURE) ×2
SUT VIC AB 0 CT1 27XBRD ANBCTR (SUTURE) ×1 IMPLANT
SUT VIC AB 2-0 CT1 27 (SUTURE) ×4
SUT VIC AB 2-0 CT1 TAPERPNT 27 (SUTURE) ×2 IMPLANT
SYR CONTROL 10ML LL (SYRINGE) IMPLANT
TOWEL OR 17X24 6PK STRL BLUE (TOWEL DISPOSABLE) ×3 IMPLANT
TOWEL OR 17X26 10 PK STRL BLUE (TOWEL DISPOSABLE) ×3 IMPLANT
TRAY FOLEY CATH 16FRSI W/METER (SET/KITS/TRAYS/PACK) IMPLANT
WATER STERILE IRR 1000ML POUR (IV SOLUTION) ×3 IMPLANT

## 2015-02-08 NOTE — Anesthesia Postprocedure Evaluation (Signed)
Anesthesia Post Note  Patient: Mark Solomon  Procedure(s) Performed: Procedure(s) (LRB): TOTAL KNEE ARTHROPLASTY (Right)  Patient location during evaluation: PACU Anesthesia Type: General and Regional Level of consciousness: sedated and patient cooperative Pain management: pain level controlled Vital Signs Assessment: post-procedure vital signs reviewed and stable Respiratory status: spontaneous breathing Cardiovascular status: stable Anesthetic complications: no    Last Vitals:  Filed Vitals:   02/08/15 1415 02/08/15 1430  BP: 136/66 125/72  Pulse: 84 79  Temp:  36.6 C  Resp: 21 13    Last Pain:  Filed Vitals:   02/08/15 1434  PainSc: 4                  Shaela Boer [employer]

## 2015-02-08 NOTE — H&P (View-Only) (Signed)
TOTAL KNEE ADMISSION H&P  Patient is being admitted for right total knee arthroplasty.  Subjective:  Chief Complaint:right knee pain.  HPI: Mark Solomon, 75 y.o. male, has a history of pain and functional disability in the right knee due to arthritis and has failed non-surgical conservative treatments for greater than 12 weeks to includeNSAID's and/or analgesics, corticosteriod injections and activity modification.  Onset of symptoms was gradual, starting 10 years ago with gradually worsening course since that time. The patient noted prior procedures on the knee to include  arthroscopy and menisectomy on the right knee(s).  Patient currently rates pain in the right knee(s) at 8 out of 10 with activity. Patient has night pain, worsening of pain with activity and weight bearing, pain that interferes with activities of daily living, pain with passive range of motion, crepitus and joint swelling.  Patient has evidence of periarticular osteophytes and joint space narrowing by imaging studies.There is no active infection.  There are no active problems to display for this patient.  Past Medical History  Diagnosis Date  . Hypertension   . Hyperlipidemia   . Diastolic dysfunction   . Aortic valve sclerosis     Past Surgical History  Procedure Laterality Date  . Rotator cuff repair    . Replacement total knee  1994    left  . Knee arthroscopy w/ meniscal repair      right   . US echocardiography  01/23/10    AOV appears sclerotic, borderline LA enlargement  . Nm myocar perf wall motion  11/14/2008    Normal     (Not in a hospital admission) Allergies no known allergies  Social History  Substance Use Topics  . Smoking status: Former Smoker    Quit date: 01/13/2008  . Smokeless tobacco: Never Used  . Alcohol Use: 0.0 oz/week    0 Standard drinks or equivalent per week    Family History  Problem Relation Age of Onset  . Diabetes Mother   . Heart attack Mother   . Heart attack  Father      Review of Systems  Musculoskeletal: Positive for joint pain.  All other systems reviewed and are negative.   Objective:  Physical Exam  Constitutional: He is oriented to person, place, and time. He appears well-developed and well-nourished. No distress.  HENT:  Head: Normocephalic and atraumatic.  Nose: Nose normal.  Eyes: Conjunctivae and EOM are normal. Pupils are equal, round, and reactive to light.  Neck: Normal range of motion. Neck supple.  Cardiovascular: Normal rate, regular rhythm, normal heart sounds and intact distal pulses.   Respiratory: Breath sounds normal. No respiratory distress. He has no wheezes.  GI: Soft. Bowel sounds are normal. He exhibits no distension. There is no tenderness.  Musculoskeletal:       Right knee: He exhibits swelling. He exhibits normal range of motion, no erythema, no LCL laxity and no MCL laxity. Tenderness found.  Lymphadenopathy:    He has no cervical adenopathy.  Neurological: He is alert and oriented to person, place, and time. No cranial nerve deficit.  Skin: Skin is warm and dry. No rash noted. No erythema.  Psychiatric: He has a normal mood and affect. His behavior is normal.    Vital signs in last 24 hours: @  Labs:   There is no height or weight on file to calculate BMI.   Imaging Review Plain radiographs demonstrate moderate degenerative joint disease of the right knee(s). The overall alignment issignificant varus. The bone  quality appears to be good for age and reported activity level.  Assessment/Plan:  End stage arthritis, right knee   The patient history, physical examination, clinical judgment of the provider and imaging studies are consistent with end stage degenerative joint disease of the right knee(s) and total knee arthroplasty is deemed medically necessary. The treatment options including medical management, injection therapy arthroscopy and arthroplasty were discussed at length. The risks  and benefits of total knee arthroplasty were presented and reviewed. The risks due to aseptic loosening, infection, stiffness, patella tracking problems, thromboembolic complications and other imponderables were discussed. The patient acknowledged the explanation, agreed to proceed with the plan and consent was signed. Patient is being admitted for inpatient treatment for surgery, pain control, PT, OT, prophylactic antibiotics, VTE prophylaxis, progressive ambulation and ADL's and discharge planning. The patient is planning to be discharged home with home health services

## 2015-02-08 NOTE — Anesthesia Preprocedure Evaluation (Addendum)
Anesthesia Evaluation  Patient identified by MRN, date of birth, ID band Patient awake    Reviewed: Allergy & Precautions, NPO status , Patient's Chart, lab work & pertinent test results  Airway Mallampati: II  TM Distance: >3 FB Neck ROM: Full    Dental no notable dental hx.    Pulmonary COPD, former smoker,    Pulmonary exam normal breath sounds clear to auscultation       Cardiovascular hypertension, negative cardio ROS Normal cardiovascular exam Rhythm:Regular Rate:Normal     Neuro/Psych negative neurological ROS  negative psych ROS   GI/Hepatic negative GI ROS, Neg liver ROS,   Endo/Other  negative endocrine ROS  Renal/GU Renal disease     Musculoskeletal  (+) Arthritis ,   Abdominal   Peds  Hematology negative hematology ROS (+)   Anesthesia Other Findings   Reproductive/Obstetrics                            Anesthesia Physical Anesthesia Plan  ASA: III  Anesthesia Plan: General and Regional   Post-op Pain Management: GA combined w/ Regional for post-op pain   Induction: Intravenous  Airway Management Planned: Oral ETT  Additional Equipment:   Intra-op Plan:   Post-operative Plan: Extubation in OR  Informed Consent: I have reviewed the patients History and Physical, chart, labs and discussed the procedure including the risks, benefits and alternatives for the proposed anesthesia with the patient or authorized representative who has indicated his/her understanding and acceptance.   Dental advisory given  Plan Discussed with: CRNA  Anesthesia Plan Comments:         Anesthesia Quick Evaluation

## 2015-02-08 NOTE — Progress Notes (Signed)
Set up with [facility] for HHPT by MD office.  

## 2015-02-08 NOTE — Evaluation (Signed)
Physical Therapy Evaluation Patient Details Name: Mark Solomon MRN: [id number] DOB: 1940-02-22 Today's Date: 02/08/2015   History of Present Illness  75 y.o. male admitted to [facility] on 02/08/15 for elective R TKA.  Pt with significant PMHx of HTN, COPD, aortic valve sclerosis, diastolic dysfunction, bil RTC repair, and L TKA (1994).   Clinical Impression  Pt is POD #0 and is limited by significant right knee buckling even with KI donned.  He was able to with mod assist get OOB and take a few tenuous steps to the recliner chair.  He moves quickly to try to get to his sitting surface, but almost sat prematurely.  I would like to think he can progress well enough to d/c home with his wife's supervision, but it will depend on his progress.   PT to follow acutely for deficits listed below.       Follow Up Recommendations Home health PT;Supervision for mobility/OOB    Equipment Recommendations  None recommended by PT    Recommendations for Other Services   NA    Precautions / Restrictions Precautions Precautions: Knee Precaution Booklet Issued: Yes (comment) Precaution Comments: knee handout given, precautions reviewed Required Braces or Orthoses: Knee Immobilizer - Right Knee Immobilizer - Right: On except when in CPM Restrictions Weight Bearing Restrictions: Yes RLE Weight Bearing: Weight bearing as tolerated      Mobility  Bed Mobility Overal bed mobility: Needs Assistance Bed Mobility: Supine to Sit     Supine to sit: Min assist;HOB elevated     General bed mobility comments: Min assist to help progress right leg over EOB.  Pt using bil hands on bed rail with HOB elevated to manage his trunk.   Transfers Overall transfer level: Needs assistance Equipment used: Rolling walker (2 wheeled) Transfers: Sit to/from Stand Sit to Stand: Min assist         General transfer comment: Min assist to support trunk during transitions. Verbal cues for safe hand placement.    Ambulation/Gait Ambulation/Gait assistance: Mod assist Ambulation Distance (Feet): 5 Feet Assistive device: Rolling walker (2 wheeled) Gait Pattern/deviations: Step-to pattern;Antalgic Gait velocity: moving a bit to fast to be safe   General Gait Details: Mod assist to support trunk over buckling right leg.  KI donned in bed, but not providing much support when pt stepping onto his right leg.  Pt able to assist with bil upper extremities to prevent falling, but therapist providing significant support as well.  Verbal cues for LE sequencing and when it is safe to sit (pt wanted to sit pre maturely).          Balance Overall balance assessment: Needs assistance Sitting-balance support: Feet supported;No upper extremity supported Sitting balance-Leahy Scale: Good     Standing balance support: Bilateral upper extremity supported Standing balance-Leahy Scale: Poor                               Pertinent Vitals/Pain Pain Assessment: 0-10 Pain Score: 7  Pain Location: right knee Pain Descriptors / Indicators: Aching;Constant Pain Intervention(s): Limited activity within patient's tolerance;Monitored during session;Repositioned;Patient requesting pain meds-RN notified    Home Living Family/patient expects to be discharged to:: Private residence Living Arrangements: Spouse/significant other Available Help at Discharge: Family;Available 24 hours/day Type of Home: House Home Access: Stairs to enter Entrance Stairs-Rails: None Entrance Stairs-Number of Steps: 1 Home Layout: One level Home Equipment: Walker - 2 wheels;Bedside commode;Shower seat;Shower seat - built in;Cane -  single point;Crutches      Prior Function Level of Independence: Independent         Comments: retired, likes to be able to take care of the yard.      Hand Dominance   Dominant Hand: Right    Extremity/Trunk Assessment   Upper Extremity Assessment: Defer to OT evaluation            Lower Extremity Assessment: RLE deficits/detail RLE Deficits / Details: right leg with normal post op pain and weakness, at least 3/5 ankle, 2-/5 knee, 2/5 hip flexion.  Functionally very poor control when WB on right leg.  Flexed and buckling knee in stance even with support of bil upper extremities.     Cervical / Trunk Assessment: Normal  Communication      Cognition Arousal/Alertness: Awake/alert Behavior During Therapy: WFL for tasks assessed/performed Overall Cognitive Status: Within Functional Limits for tasks assessed                         Exercises Total Joint Exercises Ankle Circles/Pumps: AROM;Both;10 reps      Assessment/Plan    PT Assessment Patient needs continued PT services  PT Diagnosis Difficulty walking;Abnormality of gait;Generalized weakness;Acute pain   PT Problem List Decreased strength;Decreased range of motion;Decreased activity tolerance;Decreased balance;Decreased mobility;Decreased knowledge of use of DME;Decreased knowledge of precautions;Pain  PT Treatment Interventions DME instruction;Gait training;Stair training;Functional mobility training;Therapeutic activities;Therapeutic exercise;Balance training;Neuromuscular re-education;Patient/family education;Manual techniques;Modalities   PT Goals (Current goals can be found in the Care Plan section) Acute Rehab PT Goals Patient Stated Goal: to go home at discharge PT Goal Formulation: With patient Time For Goal Achievement: 02/15/15 Potential to Achieve Goals: Good    Frequency 7X/week           End of Session Equipment Utilized During Treatment: Right knee immobilizer Activity Tolerance: Patient limited by pain Patient left: in chair;with call bell/phone within reach;with family/visitor present Nurse Communication: Mobility status;Patient requests pain meds (to RN tech, pain meds to RN)         Time: 1610-9604 PT Time Calculation (min) (ACUTE ONLY): 29 min   Charges:   PT  Evaluation $PT Eval Moderate Complexity: 1 Procedure PT Treatments $Therapeutic Activity: 8-22 mins        Daaiel Starlin B. Ziva Nunziata, PT, DPT [phone]   02/08/2015, 5:47 PM

## 2015-02-08 NOTE — Brief Op Note (Signed)
02/08/2015  12:55 PM  PATIENT:  Mark Solomon  75 y.o. male  PRE-OPERATIVE DIAGNOSIS:  OA RIGHT KNEE  POST-OPERATIVE DIAGNOSIS:  OA RIGHT KNEE  PROCEDURE:  Procedure(s): TOTAL KNEE ARTHROPLASTY (Right)  SURGEON:  Surgeon(s) and Role:    * Frederico Hamman, MD - Primary  PHYSICIAN ASSISTANT: Margart Sickles, PA-C  ASSISTANTS: OR staff x1  ANESTHESIA:   regional and general  EBL:  Total I/O In: 1700 [I.V.:1700] Out: -   BLOOD ADMINISTERED:none  DRAINS: none   LOCAL MEDICATIONS USED:  NONE  SPECIMEN:  No Specimen  DISPOSITION OF SPECIMEN:  N/A  COUNTS:  YES  TOURNIQUET:   Total Tourniquet Time Documented: Thigh (Right) - 62 minutes Total: Thigh (Right) - 62 minutes   DICTATION: .Other Dictation: Dictation Number unknown  PLAN OF CARE: Admit to inpatient   PATIENT DISPOSITION:  PACU - hemodynamically stable.   Delay start of Pharmacological VTE agent (>24hrs) due to surgical blood loss or risk of bleeding: yes

## 2015-02-08 NOTE — Anesthesia Procedure Notes (Addendum)
Procedure Name: Intubation Date/Time: 02/08/2015 10:53 AM Performed by: Fransisca Kaufmann Pre-anesthesia Checklist: Patient identified, Emergency Drugs available, Suction available, Patient being monitored and Timeout performed Patient Re-evaluated:Patient Re-evaluated prior to inductionOxygen Delivery Method: Circle system utilized Preoxygenation: Pre-oxygenation with 100% oxygen Intubation Type: IV induction Ventilation: Mask ventilation without difficulty Laryngoscope Size: Glidescope and 4 Grade View: Grade III Tube type: Oral Tube size: 7.5 mm Number of attempts: 1 Airway Equipment and Method: Stylet Placement Confirmation: ETT inserted through vocal cords under direct vision,  positive ETCO2 and breath sounds checked- equal and bilateral Secured at: 23 cm Tube secured with: Tape Dental Injury: Teeth and Oropharynx as per pre-operative assessment  Difficulty Due To: Difficulty was unanticipated   Anesthesia Regional Block:  Femoral nerve block  Pre-Anesthetic Checklist: ,, timeout performed, Correct Patient, Correct Site, Correct Laterality, Correct Procedure, Correct Position, site marked, Risks and benefits discussed, Surgical consent,  Pre-op evaluation,  Post-op pain management  Laterality: Right  Prep: chloraprep       Needles:  Injection technique: Single-shot  Needle Type: Stimulator Needle - 80     Needle Length: 9cm 9 cm Needle Gauge: 22 and 22 G  Needle insertion depth: 6 cm   Additional Needles:  Procedures: ultrasound guided (picture in chart) and nerve stimulator Femoral nerve block  Nerve Stimulator or Paresthesia:  Response: Patellar snap, 0.5 mA,   Additional Responses:   Narrative:  Injection made incrementally with aspirations every 5 mL.  Performed by: Personally  Anesthesiologist: Lewie Loron  Additional Notes: BP cuff, EKG monitors applied. Sedation begun. Femoral artery palpated for location of nerve. After nerve location verified  with U/S, anesthetic injected incrementally, slowly, and after negative aspirations under direct u/s guidance. Good perineural spread. Patient tolerated well.

## 2015-02-08 NOTE — Interval H&P Note (Signed)
History and Physical Interval Note:  02/08/2015 7:26 AM  Mark Solomon  has presented today for surgery, with the diagnosis of OA RIGHT KNEE  The various methods of treatment have been discussed with the patient and family. After consideration of risks, benefits and other options for treatment, the patient has consented to  Procedure(s): TOTAL KNEE ARTHROPLASTY (Right) as a surgical intervention .  The patient's history has been reviewed, patient examined, no change in status, stable for surgery.  I have reviewed the patient's chart and labs.  Questions were answered to the patient's satisfaction.     Robie Mcniel JR,W D

## 2015-02-08 NOTE — Progress Notes (Signed)
Utilization review completed.  

## 2015-02-08 NOTE — Progress Notes (Signed)
Orthopedic Tech Progress Note Patient Details:  Mark Solomon [birth year] [id number]  CPM Right Knee CPM Right Knee: On Right Knee Flexion (Degrees): 90 Right Knee Extension (Degrees): 0 Additional Comments: trapeze bar patient helper Viewed order from doctor's order list  [name] 02/08/2015, 1:27 PM

## 2015-02-08 NOTE — Transfer of Care (Signed)
Immediate Anesthesia Transfer of Care Note  Patient: Mark Solomon  Procedure(s) Performed: Procedure(s): TOTAL KNEE ARTHROPLASTY (Right)  Patient Location: PACU  Anesthesia Type:General  Level of Consciousness: awake, alert , oriented and sedated  Airway & Oxygen Therapy: Patient Spontanous Breathing and Patient connected to face mask oxygen  Post-op Assessment: Report given to RN, Post -op Vital signs reviewed and stable and Patient moving all extremities  Post vital signs: Reviewed and stable  Last Vitals:  Filed Vitals:   02/08/15 1024 02/08/15 1305  BP: 127/60 153/60  Pulse:  91  Temp:  36.8 C  Resp:  18    Complications: No apparent anesthesia complications

## 2015-02-08 NOTE — Op Note (Signed)
NAME:  Mark Solomon, Mark Solomon NO.:  [id number]  MEDICAL RECORD NO.:  [id number]  LOCATION:  [facility]                         FACILITY:  [facility]  PHYSICIAN:  Dyke Brackett, M.D.    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  OPERATION:  Right total knee replacement (Sigma cemented size 4 femur, tibia 41-mm all poly patella with 10 mm bearing).  SURGEON:  Dyke Brackett, M.D.  ASSISTANT:  Margart Sickles, PA-C.  ANESTHESIA:  General.  TOURNIQUET TIME:  60 minutes.  DESCRIPTION OF PROCEDURE:  Sterile prep and drape, supine position, exsanguination of leg, inflation to 350, straight skin incision with medial parapatellar with approach made.  We cut 11 mm 5-degree valgus cut off the distal femur.  Followed by cutting about 4 mm below the most diseased medial compartment with provisional cuts of additional 2 mm due to inability to straighten the knee.  We obtained full extension with a spacer block though as a result of that the cuts.  We sized the femur to be a size 4 followed by placing the all in 1 cutting block.  Appropriate degree of external rotation, with anterior posterior chamfer cuts made.  PCL was released.  Excess menisci and osteophytes removed from the posterior aspect of the knee.  Tibia sized to be size 4.  The keel hole plugged with the size 4 tibia.  Trailed the tibial base plate was placed.  Box cut for the femur was made.  The patella was cut very thick patella and large patella 41 mm all poly patellar trial was placed after size the 41 appropriate cut.  Again all parameters deemed to be acceptable.  Full extension, resolution of the varus deformity, bearing was very stable.   Good balance in ligament noted.  The bony surfaces were irrigated and removed the trials, inserted the final prosthesis with cement in a doughy state, tibia followed by femur patella.  We would like to use a trial bearing while the cement hardened.  The bearing was removed once the cement was hardened, we looked and checked at posterior aspect of the knee.  Small bits of cement were removed.  The tourniquet was released before the final bearing was placed.  No excessive bleeding was noted.  Small bleeders were coagulated.  Final bearing was placed and again all parameters deemed to be acceptable.  Closure was affected with #1 Ethibond 2-0 Vicryl skin clips, taken recovery stable condition.     Dyke Brackett, M.D.     WDC/MEDQ  D:  02/08/2015  T:  02/08/2015  Job:  [id number]

## 2015-02-08 NOTE — Interval H&P Note (Signed)
History and Physical Interval Note:  02/08/2015 7:27 AM  Mark Solomon  has presented today for surgery, with the diagnosis of OA RIGHT KNEE  The various methods of treatment have been discussed with the patient and family. After consideration of risks, benefits and other options for treatment, the patient has consented to  Procedure(s): TOTAL KNEE ARTHROPLASTY (Right) as a surgical intervention .  The patient's history has been reviewed, patient examined, no change in status, stable for surgery.  I have reviewed the patient's chart and labs.  Questions were answered to the patient's satisfaction.     [name],W D

## 2015-02-09 LAB — BASIC METABOLIC PANEL
Anion gap: 9 (ref 5–15)
BUN: 12 mg/dL (ref 6–20)
CALCIUM: 8.5 mg/dL — AB (ref 8.9–10.3)
CO2: 25 mmol/L (ref 22–32)
CREATININE: 0.82 mg/dL (ref 0.61–1.24)
Chloride: 105 mmol/L (ref 101–111)
GFR calc non Af Amer: >60 mL/min (ref >60)
Glucose, Bld: 135 mg/dL — ABNORMAL HIGH (ref 65–99)
Potassium: 4.1 mmol/L (ref 3.5–5.1)
SODIUM: 139 mmol/L (ref 135–145)

## 2015-02-09 LAB — CBC
HCT: 37.7 % — ABNORMAL LOW (ref 39.0–52.0)
Hemoglobin: 13 g/dL (ref 13.0–17.0)
MCH: 33.2 pg (ref 26.0–34.0)
MCHC: 34.5 g/dL (ref 30.0–36.0)
MCV: 96.2 fL (ref 78.0–100.0)
Platelets: 120 10*3/uL — ABNORMAL LOW (ref 150–400)
RBC: 3.92 MIL/uL — ABNORMAL LOW (ref 4.22–5.81)
RDW: 13.5 % (ref 11.5–15.5)
WBC: 6.5 10*3/uL (ref 4.0–10.5)

## 2015-02-09 NOTE — Evaluation (Signed)
Occupational Therapy Evaluation Patient Details Name: Mark Solomon MRN: [id number] DOB: 1940/09/02 Today's Date: 02/09/2015    History of Present Illness 75 y.o. male admitted to [facility] on 02/08/15 for elective R TKA.  Pt with significant PMHx of HTN, COPD, aortic valve sclerosis, diastolic dysfunction, bil RTC repair, and L TKA (1994).    Clinical Impression   Pt reports he was independent with ADLs PTA. Pt with significant buckling in bilateral knees in standing despite wearing KI on RLE; pt with one major LOB backward requiring him to sit back on bed. Pt currently overall mod assist for ADLs and functional transfers; only attempted sit to stand secondary to decreased safety with one person assist available. Pt planning to d/c home with intermittent supervision from his wife. Recommending HHOT for follow up in order to maximize independence and safety with ADLs and functional mobility upon return home. Pt would benefit from continued skilled OT to address established goals.    Follow Up Recommendations  Home health OT;Supervision/Assistance - 24 hour    Equipment Recommendations  None recommended by OT    Recommendations for Other Services       Precautions / Restrictions Precautions Precautions: Knee;Fall Precaution Comments: reviewed precautions and use of zero degree Required Braces or Orthoses: Knee Immobilizer - Right Knee Immobilizer - Right: On except when in CPM Restrictions Weight Bearing Restrictions: Yes RLE Weight Bearing: Weight bearing as tolerated      Mobility Bed Mobility Overal bed mobility: Needs Assistance Bed Mobility: Supine to Sit;Sit to Supine     Supine to sit: Min assist;HOB elevated Sit to supine: Min assist;HOB elevated   General bed mobility comments: Min assist provided to elevate trunk from supine>sit. Min assist for RLE with sit>supine. VCs throughout for hand placement and technique. HOB elevated and heavy use of  rails.  Transfers Overall transfer level: Needs assistance Equipment used: Rolling walker (2 wheeled) Transfers: Sit to/from Stand Sit to Stand: Mod assist         General transfer comment: Mod assist to boost up from EOB. Multiple unsuccessful attempts with bed in lowest position. Successful with bed elevated. Pt unable to maintain balance in standing without external support; LOB backward onto bed x 1.    Balance Overall balance assessment: Needs assistance Sitting-balance support: Feet supported;No upper extremity supported Sitting balance-Leahy Scale: Good     Standing balance support: Bilateral upper extremity supported Standing balance-Leahy Scale: Poor Standing balance comment: RW and exteral support required to maintain balance                            ADL Overall ADL's : Needs assistance/impaired Eating/Feeding: Set up;Sitting   Grooming: Set up;Sitting       Lower Body Bathing: Moderate assistance;Sit to/from stand       Lower Body Dressing: Moderate assistance;Sit to/from stand Lower Body Dressing Details (indicate cue type and reason): Pt able to don sock on R LE, unable to reach LLE. Anticipate pt would have difficulty letting go of RW to pull clothing up in standing. Pt reports wife can assist with ADLs as needed. Educated on compensatory strategies for LB ADLs. Toilet Transfer: Moderate assistance;+2 for safety/equipment;Stand-pivot;BSC;RW Toilet Transfer Details (indicate cue type and reason): Unable to attempt at this time. Pt mod assist with sit to stand, significant difficulty maintaining static balance in standing with bilateral knee buckling noted.         Functional mobility during ADLs: Moderate assistance;+2  for safety/equipment;Rolling walker General ADL Comments: Pts wife present for OT eval. Educated on home safety, need for close supervision with ADLs and functional mobility, ice for edema and pain; pt and wife verbalize  understanding. Pt noted to have buckling of bilateral knees in standing, difficulty maintaining balance in standing with LOB backward onto bed; unsafe to attempt transfers at this time with one person assist.     Vision     Perception     Praxis      Pertinent Vitals/Pain Pain Assessment: Faces Faces Pain Scale: Hurts even more Pain Location: outside of R knee Pain Descriptors / Indicators: Aching;Sore;Grimacing;Operative site guarding Pain Intervention(s): Limited activity within patient's tolerance;Monitored during session;Repositioned;Ice applied;Premedicated before session     Hand Dominance Right   Extremity/Trunk Assessment Upper Extremity Assessment Upper Extremity Assessment: Generalized weakness   Lower Extremity Assessment Lower Extremity Assessment: Defer to PT evaluation   Cervical / Trunk Assessment Cervical / Trunk Assessment: Normal   Communication Communication Communication: No difficulties   Cognition Arousal/Alertness: Awake/alert Behavior During Therapy: WFL for tasks assessed/performed Overall Cognitive Status: Within Functional Limits for tasks assessed                     General Comments       Exercises       Shoulder Instructions      Home Living Family/patient expects to be discharged to:: Private residence Living Arrangements: Spouse/significant other Available Help at Discharge: Family;Available PRN/intermittently Type of Home: House Home Access: Stairs to enter [employer] of Steps: 1 Entrance Stairs-Rails: None Home Layout: One level     Bathroom Shower/Tub: Tub/shower unit;Walk-in shower (pt uses walk in )   [employer]: Handicapped height Bathroom Accessibility: Yes How Accessible: Accessible via walker Home Equipment: Walker - 2 wheels;Bedside commode;Shower seat;Shower seat - built in;Cane - single point;Crutches          Prior Functioning/Environment Level of Independence: Independent              OT Diagnosis: Generalized weakness;Acute pain   OT Problem List: Decreased strength;Decreased range of motion;Decreased activity tolerance;Impaired balance (sitting and/or standing);Decreased safety awareness;Decreased knowledge of use of DME or AE;Decreased knowledge of precautions;Pain   OT Treatment/Interventions: Self-care/ADL training;Energy conservation;DME and/or AE instruction;Therapeutic activities;Patient/family education    OT Goals(Current goals can be found in the care plan section) Acute Rehab OT Goals Patient Stated Goal: to go home at discharge OT Goal Formulation: With patient/family Time For Goal Achievement: 02/23/15 Potential to Achieve Goals: Good ADL Goals Pt Will Perform Grooming: with min guard assist;standing Pt Will Transfer to Toilet: with min guard assist;ambulating;bedside commode (BSC over toilet) Pt Will Perform Toileting - Clothing Manipulation and hygiene: with min guard assist;sit to/from stand Pt Will Perform Tub/Shower Transfer: Shower transfer;with min guard assist;ambulating;shower seat;rolling walker  OT Frequency: Min 2X/week   Barriers to D/C:            Co-evaluation              End of Session Equipment Utilized During Treatment: Gait belt;Rolling walker;Right knee immobilizer CPM Right Knee CPM Right Knee: Off Nurse Communication: Mobility status (RN tech notified)  Activity Tolerance: Patient limited by pain;Patient limited by fatigue Patient left: in bed;with call bell/phone within reach;with family/visitor present   Time: 1440-1459 OT Time Calculation (min): 19 min Charges:  OT General Charges $OT Visit: 1 Procedure OT Evaluation $OT Eval Moderate Complexity: 1 Procedure G-Codes:     Gaye Alken M.S.,  OTR/L Pager: [phone]  02/09/2015, 3:19 PM

## 2015-02-09 NOTE — Discharge Summary (Signed)
Patient ID: Mark Solomon MRN: [id number] DOB/AGE: May 07, 1940 75 y.o.  Admit date: 02/08/2015 Discharge date: 02/10/2015  Admission Diagnoses:  Active Problems:   Primary localized osteoarthritis of right knee   Discharge Diagnoses:  Same  Past Medical History  Diagnosis Date  . Hypertension   . Hyperlipidemia   . Diastolic dysfunction   . Aortic valve sclerosis   . COPD (chronic obstructive pulmonary disease) (HCC)   . Kidney stone   . Arthritis     Surgeries: Procedure(s): TOTAL KNEE ARTHROPLASTY on 02/08/2015   Consultants:    Discharged Condition: Improved  Hospital Course: Mark Solomon is an 75 y.o. male who was admitted 02/08/2015 for operative treatment of primary localized osteoarthritis right knee. Patient has severe unremitting pain that affects sleep, daily activities, and work/hobbies. After pre-op clearance the patient was taken to the operating room on 02/08/2015 and underwent  Procedure(s): TOTAL KNEE ARTHROPLASTY.  Patient with a pre-op Hb of 14.0 developed ABLA on pod#2 with a Hb of 12.0.  He is currently stable but we will continue to follow.  Patient was given perioperative antibiotics:      Anti-infectives    Start     Dose/Rate Route Frequency Ordered Stop   02/08/15 1800  ceFAZolin (ANCEF) IVPB 1 g/50 mL premix     1 g 100 mL/hr over 30 Minutes Intravenous Every 6 hours 02/08/15 1554 02/09/15 0105   02/08/15 1000  ceFAZolin (ANCEF) IVPB 2 g/50 mL premix     2 g 100 mL/hr over 30 Minutes Intravenous To [facility] 02/07/15 0950 02/08/15 1053       Patient was given sequential compression devices, early ambulation, and chemoprophylaxis to prevent DVT.  Patient benefited maximally from hospital stay and there were no complications.    Recent vital signs:  Patient Vitals for the past 24 hrs:  BP Temp Temp src Pulse Resp SpO2 Height Weight  02/10/15 0735 123/68 mmHg 98.4 F (36.9 C) Oral 93 16 96 % - -  02/09/15 2148 (!) 121/58 mmHg  99.5 F (37.5 C) Oral 95 16 94 % - -  02/09/15 1500 - - - - - -  (1.753 m) 92.534 kg (204 lb)  02/09/15 1356 117/63 mmHg 98.9 F (37.2 C) Oral 88 16 92 % - -     Recent laboratory studies:   Recent Labs  02/08/15 2000 02/09/15 0535 02/10/15 0525  WBC 7.3 6.5 6.5  HGB 14.0 13.0 12.0*  HCT 41.4 37.7* 35.7*  PLT 121* 120* PENDING  NA  --  139  --   K  --  4.1  --   CL  --  105  --   CO2  --  25  --   BUN  --  12  --   CREATININE 0.79 0.82  --   GLUCOSE  --  135*  --   CALCIUM  --  8.5*  --      Discharge Medications:     Medication List    STOP taking these medications        aspirin 81 MG tablet      TAKE these medications        apixaban 2.5 MG Tabs tablet  Commonly known as:  ELIQUIS  Take 1 tablet (2.5 mg total) by mouth 2 (two) times daily.     beclomethasone 40 MCG/ACT inhaler  Commonly known as:  QVAR  Inhale 1 puff into the lungs daily as needed.     COQ10  PO  Take 1 capsule by mouth daily.     ezetimibe-simvastatin 10-40 MG tablet  Commonly known as:  VYTORIN  Take 1 tablet by mouth daily.     fluticasone 50 MCG/ACT nasal spray  Commonly known as:  FLONASE  Place 2 sprays into both nostrils every morning.     ketoconazole 2 % shampoo  Commonly known as:  NIZORAL  Apply 1 application topically daily.     losartan 50 MG tablet  Commonly known as:  COZAAR  Take 50 mg by mouth daily.     multivitamin with minerals tablet  Take 1 tablet by mouth daily.     oxyCODONE-acetaminophen 5-325 MG tablet  Commonly known as:  ROXICET  Take 1-2 tablets by mouth every 4 (four) hours as needed for moderate pain or severe pain.     tamsulosin 0.4 MG Caps capsule  Commonly known as:  FLOMAX  Take 0.4 mg by mouth daily.        Diagnostic Studies: Dg Chest 2 View  01/28/2015  CLINICAL DATA:  Total knee arthroplasty EXAM: CHEST  2 VIEW COMPARISON:  None. FINDINGS: The heart size and mediastinal contours are within normal limits. Both lungs are  clear. The visualized skeletal structures are unremarkable. IMPRESSION: No active cardiopulmonary disease. Electronically Signed   By: Elige Ko   On: 01/28/2015 13:42    Disposition:     Follow-up Information    Follow up with CAFFREY JR,W D, MD. Schedule an appointment as soon as possible for a visit in 2 weeks.   Specialty:  Orthopedic Surgery   Contact information:   [address] [place] [address] [phone]       Follow up with [facility].   Why:  They will contact you to schedule home therapy visits.   Contact information:   [address] [place] [address] [phone]        Signed: Otilio Saber 02/10/2015, 8:47 AM

## 2015-02-09 NOTE — Progress Notes (Signed)
Physical Therapy Treatment Patient Details Name: Mark Solomon MRN: [id number] DOB: Mar 03, 1940 Today's Date: 02/09/2015    History of Present Illness 75 y.o. male admitted to [facility] on 02/08/15 for elective R TKA.  Pt with significant PMHx of HTN, COPD, aortic valve sclerosis, diastolic dysfunction, bil RTC repair, and L TKA (1994).     PT Comments    Pt not progressing well with mobility at this time due to significant knee buckling despite wearing KI.  Required increased assistance to achieve standing from bed and significant mod assist for trunk support/stability when walking.  Will cont to follow acutely and with POC but will need much more progress before safe to d/c home from this clinician's standpoint.      Follow Up Recommendations  Home health PT;Supervision for mobility/OOB     Equipment Recommendations  None recommended by PT    Recommendations for Other Services       Precautions / Restrictions Precautions Precautions: Knee Precaution Comments: knee handout given, precautions reviewed Required Braces or Orthoses: Knee Immobilizer - Right Knee Immobilizer - Right: On except when in CPM Restrictions Weight Bearing Restrictions: Yes RLE Weight Bearing: Weight bearing as tolerated    Mobility  Bed Mobility Overal bed mobility: Needs Assistance Bed Mobility: Supine to Sit     Supine to sit: HOB elevated;Min assist     General bed mobility comments: (A) to elevate trunk/shoulders to sitting upright.  HOB elevated as pt states they have adjustable bed at home.    Transfers Overall transfer level: Needs assistance Equipment used: Rolling walker (2 wheeled) Transfers: Sit to/from Stand Sit to Stand: Mod assist;Max assist         General transfer comment: Mod assist to power up to standing from bed & max assist to power to standing from low surface (couch).  Instruction for safe hand placement however pt unable to achieve standing without pulling on RW.     Ambulation/Gait Ambulation/Gait assistance: Mod assist Ambulation Distance (Feet):  (10' + 5' ) Assistive device: Rolling walker (2 wheeled) Gait Pattern/deviations: Step-to pattern;Antalgic Gait velocity: moving a bit to fast to be safe   General Gait Details: (A) to support trunk over buckling Rt knee despite KI being donned prior to standing.  Max cues for sequencing and safety as pt tends to move quickly.  Pt cont's to attempt to sit prematurely despite cues for safety   Stairs            Wheelchair Mobility    Modified Rankin (Stroke Patients Only)       Balance                                    Cognition Arousal/Alertness: Awake/alert Behavior During Therapy: WFL for tasks assessed/performed Overall Cognitive Status: Within Functional Limits for tasks assessed                      Exercises Total Joint Exercises Ankle Circles/Pumps: AROM;Both;10 reps Quad Sets: AROM;Both;20 reps;Strengthening Heel Slides: AAROM;Strengthening;Right;10 reps Hip ABduction/ADduction: AAROM;Strengthening;Right;10 reps Straight Leg Raises: AAROM;Right;10 reps    General Comments        Pertinent Vitals/Pain Pain Assessment: 0-10 Pain Score: 7  Pain Location: rt knee Pain Descriptors / Indicators: Aching Pain Intervention(s): Monitored during session;Premedicated before session;Repositioned    Home Living  Prior Function            PT Goals (current goals can now be found in the care plan section) Acute Rehab PT Goals Patient Stated Goal: to go home at discharge PT Goal Formulation: With patient Time For Goal Achievement: 02/15/15 Potential to Achieve Goals: Good Progress towards PT goals: Not progressing toward goals - comment (due to knee buckling)    Frequency  7X/week    PT Plan Current plan remains appropriate    Co-evaluation             End of Session Equipment Utilized During Treatment:  Right knee immobilizer Activity Tolerance: Patient tolerated treatment well Patient left: in chair;with call bell/phone within reach;with family/visitor present     Time: 1610-9604 PT Time Calculation (min) (ACUTE ONLY): 38 min  Charges:  $Gait Training: 8-22 mins $Therapeutic Exercise: 8-22 mins $Therapeutic Activity: 8-22 mins                    G Codes:      Lara Mulch 02/09/2015, 11:35 AM   Verdell Face, PTA [phone] 02/09/2015

## 2015-02-09 NOTE — Progress Notes (Signed)
Subjective: 1 Day Post-Op Procedure(s) (LRB): TOTAL KNEE ARTHROPLASTY (Right) Patient reports pain as mild.  Patient reported having mild lightheadedness and nausea yesterday, but none today.  No chest pain/sob.  Positive flatus.  Good appetite.  Objective: Vital signs in last 24 hours: Temp:  [97.7 F (36.5 C)-98.5 F (36.9 C)] 98.5 F (36.9 C) (01/28 0559) Pulse Rate:  [70-95] 95 (01/28 0559) Resp:  [13-22] 18 (01/28 0559) BP: (114-153)/(50-100) 121/58 mmHg (01/28 0559) SpO2:  [95 %-100 %] 96 % (01/28 0559) Weight:  [92.534 kg (204 lb)] 92.534 kg (204 lb) (01/27 0837)  Intake/Output from previous day: 01/27 0701 - 01/28 0700 In: 1820 [P.O.:120; I.V.:1700] Out: 150 [Urine:150] Intake/Output this shift:     Recent Labs  02/08/15 2000 02/09/15 0535  HGB 14.0 13.0    Recent Labs  02/08/15 2000 02/09/15 0535  WBC 7.3 6.5  RBC 4.33 3.92*  HCT 41.4 37.7*  PLT 121* 120*    Recent Labs  02/08/15 2000 02/09/15 0535  NA  --  139  K  --  4.1  CL  --  105  CO2  --  25  BUN  --  12  CREATININE 0.79 0.82  GLUCOSE  --  135*  CALCIUM  --  8.5*   No results for input(s): LABPT, INR in the last 72 hours.  Neurologically intact Neurovascular intact Sensation intact distally Intact pulses distally Dorsiflexion/Plantar flexion intact Compartment soft  Negative homans bilaterally No drainage noted through dressing  Assessment/Plan: 1 Day Post-Op Procedure(s) (LRB): TOTAL KNEE ARTHROPLASTY (Right) Advance diet Up with therapy D/C IV fluids Plan for discharge tomorrow to home with hhpt WBAT RLE Dry dressing change prn   Otilio Saber 02/09/2015, 7:52 AM

## 2015-02-09 NOTE — Care Management Note (Signed)
Case Management Note  Patient Details  Name: TYLAND KLEMENS MRN: [id number] Date of Birth: 11/17/1940  Subjective/Objective:                  TOTAL KNEE ARTHROPLASTY (Right)  Action/Plan: CM spoke with patient at the bedside. Patient reports he has a RW, 3N1, crutches and a cane at home. No DME needs.   Expected Discharge Date:    02/09/15              Expected Discharge Plan:  Home w Home Health Services  In-House Referral:  NA  Discharge planning Services  CM Consult  Post Acute Care Choice:  Home Health Choice offered to:  Patient  DME Arranged:  N/A DME Agency:  NA  HH Arranged:    HH Agency:  [facility]  Status of Service:  Completed, signed off  Medicare Important Message Given:    Date Medicare IM Given:    Medicare IM give by:    Date Additional Medicare IM Given:    Additional Medicare Important Message give by:     If discussed at Long Length of Stay Meetings, dates discussed:    Additional Comments:  Antony Haste, RN 02/09/2015, 9:51 AM

## 2015-02-10 LAB — CBC
HEMATOCRIT: 35.7 % — AB (ref 39.0–52.0)
HEMOGLOBIN: 12 g/dL — AB (ref 13.0–17.0)
MCH: 32.2 pg (ref 26.0–34.0)
MCHC: 33.6 g/dL (ref 30.0–36.0)
MCV: 95.7 fL (ref 78.0–100.0)
Platelets: 107 10*3/uL — ABNORMAL LOW (ref 150–400)
RBC: 3.73 MIL/uL — AB (ref 4.22–5.81)
RDW: 13.3 % (ref 11.5–15.5)
WBC: 6.5 10*3/uL (ref 4.0–10.5)

## 2015-02-10 NOTE — Progress Notes (Signed)
Subjective: 2 Days Post-Op Procedure(s) (LRB): TOTAL KNEE ARTHROPLASTY (Right) Patient reports pain as mild.  Patient reports mild lightheadedness when sitting up.  No nausea/vomiting, chest pain/sob.  Positive flatus no bm.  Tolerating diet.  Objective: Vital signs in last 24 hours: Temp:  [98.4 F (36.9 C)-99.5 F (37.5 C)] 98.4 F (36.9 C) (01/29 0735) Pulse Rate:  [88-95] 93 (01/29 0735) Resp:  [16] 16 (01/29 0735) BP: (117-123)/(58-68) 123/68 mmHg (01/29 0735) SpO2:  [92 %-96 %] 96 % (01/29 0735) Weight:  [92.534 kg (204 lb)] 92.534 kg (204 lb) (01/28 1500)  Intake/Output from previous day: 01/28 0701 - 01/29 0700 In: 360 [P.O.:360] Out: 250 [Urine:250] Intake/Output this shift:     Recent Labs  02/08/15 2000 02/09/15 0535 02/10/15 0525  HGB 14.0 13.0 12.0*    Recent Labs  02/09/15 0535 02/10/15 0525  WBC 6.5 6.5  RBC 3.92* 3.73*  HCT 37.7* 35.7*  PLT 120* PENDING    Recent Labs  02/08/15 2000 02/09/15 0535  NA  --  139  K  --  4.1  CL  --  105  CO2  --  25  BUN  --  12  CREATININE 0.79 0.82  GLUCOSE  --  135*  CALCIUM  --  8.5*   No results for input(s): LABPT, INR in the last 72 hours.  Neurologically intact Neurovascular intact Sensation intact distally Intact pulses distally Dorsiflexion/Plantar flexion intact Incision: dressing C/D/I No cellulitis present Compartment soft  Negative homans bilaterally Dressing changed by me today  Assessment/Plan: 2 Days Post-Op Procedure(s) (LRB): TOTAL KNEE ARTHROPLASTY (Right) Advance diet Up with therapy Plan for discharge tomorrow home with hhpt WBAT RLE ABLA-mild and stable Dry dressing change prn  Otilio Saber 02/10/2015, 9:30 AM

## 2015-02-10 NOTE — Progress Notes (Signed)
Physical Therapy Treatment Patient Details Name: Mark Solomon MRN: [id number] DOB: 15-Sep-1940 Today's Date: 02/10/2015    History of Present Illness 75 y.o. male admitted to [facility] on 02/08/15 for elective R TKA.  Pt with significant PMHx of HTN, COPD, aortic valve sclerosis, diastolic dysfunction, bil RTC repair, and L TKA (1994).     PT Comments    Making progress with mobility, but still tends to walk too fast, and noting still occasional R knee buckling even within the KI, leading to increased fall risk; near constant cues to activate R quad for stance stability;  Will still likely be able to dc home tomorrow; Would like to have 2 PT sessions and an OT session before dc if possible for more practice prior to going home   Follow Up Recommendations  Home health PT;Supervision for mobility/OOB     Equipment Recommendations  None recommended by PT    Recommendations for Other Services       Precautions / Restrictions Precautions Precautions: Knee;Fall Precaution Comments: reviewed precautions and use of zero degree Required Braces or Orthoses: Knee Immobilizer - Right Knee Immobilizer - Right: On except when in CPM Restrictions RLE Weight Bearing: Weight bearing as tolerated  Pt educated to not allow any pillow or bolster under knee for healing with optimal range of motion.    Mobility  Bed Mobility Overal bed mobility: Needs Assistance Bed Mobility: Supine to Sit;Sit to Supine     Supine to sit: Min assist Sit to supine: Min assist   General bed mobility comments: Min handheld assist to pull to sit; Required more simplified, step-by-step cues for moving to the EOB in prep for getting up, and for centering self in teh bed after lying down; min assist as well to help RLE into bed  Transfers Overall transfer level: Needs assistance Equipment used: Rolling walker (2 wheeled) Transfers: Sit to/from Stand Sit to Stand: Mod assist;+2 safety/equipment         General  transfer comment: Mod assist to power up; cues for hand placement and safety, requiring repetition of cues  Ambulation/Gait Ambulation/Gait assistance: Mod assist;Min assist;+2 safety/equipment Ambulation Distance (Feet): 70 Feet Assistive device: Rolling walker (2 wheeled) Gait Pattern/deviations: Decreased step length - left;Decreased stance time - right;Step-through pattern;Antalgic Gait velocity: moving a bit to fast to be safe; at least moderate cues to slow down, and at times required assist to hold RW back for help with controlling speed   General Gait Details: (A) to support trunk over LE's and min assist for RW management.  Max cueing for sequencing, quad activation during stance phase, and safety as he cont's to make quick steps increasing fall risk.  Some knee buckling noted inititally this session; does lack terminal knee extension during stance phase.      Stairs            Wheelchair Mobility    Modified Rankin (Stroke Patients Only)       Balance     Sitting balance-Leahy Scale: Good       Standing balance-Leahy Scale: Poor                      Cognition Arousal/Alertness: Awake/alert Behavior During Therapy: WFL for tasks assessed/performed;Impulsive Overall Cognitive Status: Impaired/Different from baseline Area of Impairment: Problem solving             Problem Solving: Difficulty sequencing;Requires verbal cues;Requires tactile cues General Comments: Noting some difficulty with directional cues; Wife and son present  and did not indicate a departure from his baseline (but I did not get the chance to talk with them away from the pt)    Exercises Total Joint Exercises Ankle Circles/Pumps: AROM;Both;10 reps Quad Sets: Strengthening;Both;10 reps Hip ABduction/ADduction: AAROM;Strengthening;Right;10 reps Straight Leg Raises: AAROM;Strengthening;Right;10 reps    General Comments        Pertinent Vitals/Pain Pain Assessment: Faces Pain  Score: 8  Faces Pain Scale: Hurts little more Pain Location: R knee Pain Descriptors / Indicators: Grimacing Pain Intervention(s): Monitored during session;Repositioned    Home Living                      Prior Function            PT Goals (current goals can now be found in the care plan section) Acute Rehab PT Goals Patient Stated Goal: to go home at discharge PT Goal Formulation: With patient Time For Goal Achievement: 02/15/15 Potential to Achieve Goals: Good Progress towards PT goals: Progressing toward goals    Frequency  7X/week    PT Plan Current plan remains appropriate    Co-evaluation             End of Session Equipment Utilized During Treatment: Gait belt;Right knee immobilizer Activity Tolerance: Patient tolerated treatment well Patient left: in bed;with call bell/phone within reach;Other (comment) (positioned optimally in Zero Knee Bone Foam)     Time: 1191-4782 PT Time Calculation (min) (ACUTE ONLY): 32 min  Charges:  $Gait Training: 8-22 mins $Therapeutic Exercise: 8-22 mins $Therapeutic Activity: 8-22 mins                    G Codes:      Mark Solomon 02/10/2015, 1:36 PM  Van Clines, [place]  Acute Rehabilitation Services Pager [phone] Office [phone]

## 2015-02-10 NOTE — Progress Notes (Signed)
Occupational Therapy Treatment Patient Details Name: Mark Solomon MRN: [id number] DOB: 1940/09/04 Today's Date: 02/10/2015    History of present illness 75 y.o. male admitted to [facility] on 02/08/15 for elective R TKA.  Pt with significant PMHx of HTN, COPD, aortic valve sclerosis, diastolic dysfunction, bil RTC repair, and L TKA (1994).    OT comments  Pt making gradual progress toward OT goals; able to progress functional mobility today with less knee buckling. Pt continues to have R knee buckling even with KI donned. Pt currently requiring mod assist +2 for toilet transfers with mobility to bathroom and grooming activity in standing. VCs throughout all functional activities for sequencing and safety. D/c plan remains appropriate at this time. Will continue to follow acutely.    Follow Up Recommendations  Home health OT;Supervision/Assistance - 24 hour    Equipment Recommendations  None recommended by OT    Recommendations for Other Services      Precautions / Restrictions Precautions Precautions: Knee;Fall Precaution Comments: reviewed precautions and use of zero degree Required Braces or Orthoses: Knee Immobilizer - Right Knee Immobilizer - Right: On except when in CPM Restrictions Weight Bearing Restrictions: Yes RLE Weight Bearing: Weight bearing as tolerated       Mobility Bed Mobility Overal bed mobility: Needs Assistance Bed Mobility: Supine to Sit;Sit to Supine     Supine to sit: Min assist Sit to supine: Min assist   General bed mobility comments: Min handheld assist to pull to sit; Required more simplified, step-by-step cues for moving to the EOB in prep for getting up, and for centering self in teh bed after lying down; min assist as well to help RLE into bed  Transfers Overall transfer level: Needs assistance Equipment used: Rolling walker (2 wheeled) Transfers: Sit to/from Stand Sit to Stand: Mod assist;+2 safety/equipment         General transfer  comment: Mod assist to power up; cues for hand placement and safety, requiring repetition of cues    Balance Overall balance assessment: Needs assistance Sitting-balance support: Feet supported Sitting balance-Leahy Scale: Good     Standing balance support: No upper extremity supported;During functional activity Standing balance-Leahy Scale: Poor Standing balance comment: Pt required external assist for balance when standing at sink to wash hands and void without UE support                   ADL Overall ADL's : Needs assistance/impaired     Grooming: Minimal assistance;Wash/dry hands;Standing Grooming Details (indicate cue type and reason): Min assist for balance in standing                 Toilet Transfer: Moderate assistance;+2 for safety/equipment;Ambulation;RW Toilet Transfer Details (indicate cue type and reason): Pt able to stand at toilet to void. Mod +2 for functional mobility to bathroom, min assist for balance in standing.         Functional mobility during ADLs: Moderate assistance;+2 for safety/equipment;Rolling walker General ADL Comments: Pts wife and son present for OT session. Pt with improvements in knee buckling from session yesterday but pt continues to have difficulty with R knee buckling even in KI. No major LOB noted but pt clearly unsteady on his feet requiring assist for balance.      Vision                     Perception     Praxis      Cognition   Behavior During Therapy: [facility] for  tasks assessed/performed;Impulsive Overall Cognitive Status: Impaired/Different from baseline Area of Impairment: Problem solving              Problem Solving: Difficulty sequencing;Requires verbal cues;Requires tactile cues General Comments: Noting some difficulty with directional cues; Wife and son present and did not indicate a departure from his baseline (but I did not get the chance to talk with them away from the pt)    Extremity/Trunk  Assessment               Exercises Total Joint Exercises Ankle Circles/Pumps: AROM;Both;10 reps Quad Sets: Strengthening;Both;10 reps Hip ABduction/ADduction: AAROM;Strengthening;Right;10 reps Straight Leg Raises: AAROM;Strengthening;Right;10 reps   Shoulder Instructions       General Comments      Pertinent Vitals/ Pain       Pain Assessment: Faces Pain Score: 8  Faces Pain Scale: Hurts little more Pain Location: R knee Pain Descriptors / Indicators: Grimacing Pain Intervention(s): Monitored during session;Repositioned  Home Living                                          Prior Functioning/Environment              Frequency Min 2X/week     Progress Toward Goals  OT Goals(current goals can now be found in the care plan section)  Progress towards OT goals: Progressing toward goals  Acute Rehab OT Goals Patient Stated Goal: to go home at discharge OT Goal Formulation: With patient/family  Plan Discharge plan remains appropriate    Co-evaluation    PT/OT/SLP Co-Evaluation/Treatment: Yes Reason for Co-Treatment: For patient/therapist safety   OT goals addressed during session: ADL's and self-care      End of Session Equipment Utilized During Treatment: Gait belt;Rolling walker;Right knee immobilizer   Activity Tolerance Patient tolerated treatment well   Patient Left in bed;with call bell/phone within reach;with family/visitor present;Other (comment) (zero degree bone foam applied to RLE)   Nurse Communication          Time: 1610-9604 OT Time Calculation (min): 32 min  Charges: OT General Charges $OT Visit: 1 Procedure OT Treatments $Self Care/Home Management : 8-22 mins  Gaye Alken M.S., OTR/L Pager: [phone]  02/10/2015, 1:54 PM

## 2015-02-10 NOTE — Progress Notes (Signed)
Physical Therapy Treatment Patient Details Name: Mark Solomon MRN: [id number] DOB: 10-10-1940 Today's Date: 02/10/2015    History of Present Illness 75 y.o. male admitted to [facility] on 02/08/15 for elective R TKA.  Pt with significant PMHx of HTN, COPD, aortic valve sclerosis, diastolic dysfunction, bil RTC repair, and L TKA (1994).     PT Comments    Pt currently requires min assist for supine>sitting EOB with HOB elevated, mod assist +2 for sit/stand transfers, and min assist +2 for safety with gait.  Requires mod/max cues for safety with mobility.  No knee buckling noted with gt today but does cont to try to move quickly increasing fall risk requiring max cues to slow down and for sequencing.  Question pt's safety awareness in general as he made comment that he feels he would be safe to d/c home with his wife today despite requiring heavy mod assist +2 to achieve standing.  Will cont to follow acutely to maximize functional mobility and safety prior to d/c home.      Follow Up Recommendations  Home health PT;Supervision for mobility/OOB     Equipment Recommendations  None recommended by PT    Recommendations for Other Services       Precautions / Restrictions Precautions Precautions: Knee;Fall Precaution Comments: reviewed precautions and use of zero degree Required Braces or Orthoses: Knee Immobilizer - Right Knee Immobilizer - Right: On except when in CPM Restrictions RLE Weight Bearing: Weight bearing as tolerated    Mobility  Bed Mobility Overal bed mobility: Needs Assistance Bed Mobility: Sit to Supine     Supine to sit: Min assist     General bed mobility comments: (A) to bring shoulders/trunk to sitting upright.  VC's for sequencing & technique throughout.  HOB elevated with no use of rails  Transfers Overall transfer level: Needs assistance Equipment used: Rolling walker (2 wheeled) Transfers: Sit to/from Stand Sit to Stand: Mod assist;+2 safety/equipment          General transfer comment: Mod assist to achieve standing from EOB with elevated height.  (A) to stabilize with initial standing and support trunk over LE's.    Ambulation/Gait Ambulation/Gait assistance: Min assist;+2 safety/equipment Ambulation Distance (Feet): 30 Feet Assistive device: Rolling walker (2 wheeled) Gait Pattern/deviations: Step-through pattern;Antalgic Gait velocity: moving a bit to fast to be safe   General Gait Details: (A) to support trunk over LE's and min assist for RW management.  Max cueing for sequencing, quad activation during stance phase, and safety as he cont's to make quick steps increasing fall risk.  No knee buckling noted today but does lack terminal knee extension during stance phase.      Stairs            Wheelchair Mobility    Modified Rankin (Stroke Patients Only)       Balance                                    Cognition Arousal/Alertness: Awake/alert Behavior During Therapy: WFL for tasks assessed/performed Overall Cognitive Status: Within Functional Limits for tasks assessed                      Exercises Total Joint Exercises Ankle Circles/Pumps: AROM;Both;10 reps Quad Sets: Strengthening;Both;10 reps Hip ABduction/ADduction: AAROM;Strengthening;Right;10 reps Straight Leg Raises: AAROM;Strengthening;Right;10 reps    General Comments        Pertinent Vitals/Pain Pain Assessment:  0-10 Pain Score: 8  Pain Location: rt knee Pain Descriptors / Indicators: Aching Pain Intervention(s): Limited activity within patient's tolerance;Monitored during session;Repositioned;RN gave pain meds during session;Ice applied    Home Living                      Prior Function            PT Goals (current goals can now be found in the care plan section) Acute Rehab PT Goals Patient Stated Goal: to go home at discharge PT Goal Formulation: With patient Time For Goal Achievement:  02/15/15 Potential to Achieve Goals: Good Progress towards PT goals: Progressing toward goals    Frequency  7X/week    PT Plan Current plan remains appropriate    Co-evaluation             End of Session Equipment Utilized During Treatment: Gait belt;Right knee immobilizer Activity Tolerance: Patient tolerated treatment well Patient left: in chair;with call bell/phone within reach;with family/visitor present     Time: 1610-9604 PT Time Calculation (min) (ACUTE ONLY): 41 min  Charges:  $Gait Training: 8-22 mins $Therapeutic Exercise: 8-22 mins $Therapeutic Activity: 8-22 mins                    G Codes:      Lara Mulch 02/10/2015, 10:48 AM   Verdell Face, PTA [phone] 02/10/2015

## 2015-02-11 ENCOUNTER — Encounter (HOSPITAL_COMMUNITY): Payer: Self-pay | Admitting: Orthopedic Surgery

## 2015-02-11 LAB — CBC
HCT: 34.3 % — ABNORMAL LOW (ref 39.0–52.0)
HEMOGLOBIN: 12.1 g/dL — AB (ref 13.0–17.0)
MCH: 32.8 pg (ref 26.0–34.0)
MCHC: 35.3 g/dL (ref 30.0–36.0)
MCV: 93 fL (ref 78.0–100.0)
Platelets: 142 10*3/uL — ABNORMAL LOW (ref 150–400)
RBC: 3.69 MIL/uL — AB (ref 4.22–5.81)
RDW: 13.1 % (ref 11.5–15.5)
WBC: 8.3 10*3/uL (ref 4.0–10.5)

## 2015-02-11 MED ORDER — TRAMADOL HCL 50 MG PO TABS: ORAL_TABLET | ORAL | Status: DC

## 2015-02-11 MED ORDER — TRAMADOL HCL 50 MG PO TABS: 100.0000 mg | ORAL_TABLET | Freq: Four times a day (QID) | ORAL | Status: DC | PRN

## 2015-02-11 MED ORDER — SODIUM CHLORIDE 0.45 % IV BOLUS
1000.0000 mL | Freq: Once | INTRAVENOUS | Status: AC
  Administered 2015-02-11: 1000 mL via INTRAVENOUS

## 2015-02-11 MED ORDER — HYDROCODONE-ACETAMINOPHEN 5-325 MG PO TABS: 1.0000 | ORAL_TABLET | Freq: Four times a day (QID) | ORAL | Status: DC | PRN

## 2015-02-11 NOTE — Progress Notes (Signed)
Physical Therapy Treatment Patient Details Name: Mark Solomon MRN: [id number] DOB: 04/18/1940 Today's Date: 02/11/2015    History of Present Illness 75 y.o. male admitted to [facility] on 02/08/15 for elective R TKA.  Pt with significant PMHx of HTN, COPD, aortic valve sclerosis, diastolic dysfunction, bil RTC repair, and L TKA (1994).     PT Comments    Pt requires many verbal cues to walk within the walker and cues for coordination of the walker and step sequencing. Pt demonstrates understanding of cues, but quickly reverts back to trying to walk too quickly and outside his base of support. Pt requires mod assist to transfer from sit to stand due to R knee buckling and weakness.   Follow Up Recommendations  Home health PT;Supervision for mobility/OOB     Equipment Recommendations  None recommended by PT       Precautions / Restrictions Precautions Precautions: Knee;Fall Precaution Booklet Issued: No Precaution Comments: Reviewed use of KI with Pt and family; reinforced Pt is WBAT on RLE  Required Braces or Orthoses: Knee Immobilizer - Right Knee Immobilizer - Right: On when out of bed or walking Restrictions Weight Bearing Restrictions: Yes RLE Weight Bearing: Weight bearing as tolerated    Mobility  Bed Mobility Overal bed mobility:  (Pt received and returned to recliner )               Transfers Overall transfer level: Needs assistance Equipment used: Rolling walker (2 wheeled) Transfers: Sit to/from Stand Sit to Stand: Mod assist         General transfer comment: Pt requires mod assist to transfer from sitting in the recliner to standing due to his right knee buckling and weakness. Once in static standing and Pt's balance has returned, Pt is more steady on his feet. Pt requies cues for hand placement.  Ambulation/Gait Ambulation/Gait assistance: Min assist;+2 safety/equipment Ambulation Distance (Feet): 75 Feet Assistive device: Rolling walker (2 wheeled) Gait  Pattern/deviations: Step-to pattern;Antalgic;Decreased weight shift to right Gait velocity: Pt moves too quickly to maintain safety   General Gait Details: Pt requires multiple verbal cues to stay within the base of the walker. Pt pushes the walker way out in front of him and tries to walk quickly to keep up with the walker. Pt requires cues to slow down. Pt also requries cues for walker and step sequencing.                       Balance Overall balance assessment: Needs assistance Sitting-balance support: Feet supported;No upper extremity supported Sitting balance-Leahy Scale: Good     Standing balance support: Bilateral upper extremity supported Standing balance-Leahy Scale: Poor Standing balance comment: Pt requies bilateral UE support on RW plus min assist to maintain static standing balance                    Cognition Arousal/Alertness: Awake/alert Behavior During Therapy: WFL for tasks assessed/performed Overall Cognitive Status: Impaired/Different from baseline Area of Impairment: Following commands   Current Attention Level: Selective Memory: Decreased short-term memory Following Commands: Follows one step commands inconsistently Safety/Judgement: Decreased awareness of safety;Decreased awareness of deficits   Problem Solving: Difficulty sequencing;Requires verbal cues;Requires tactile cues General Comments: Pt required many cues for the same thing. Pt would correct motion/demonstrate understanding of the cue, but very quickly revert back to former and incorrect means of moving.     Exercises Total Joint Exercises Ankle Circles/Pumps: AROM;Both;20 reps Quad Sets: AAROM;Right;10 reps Heel Slides:  AAROM;Right;10 reps Hip ABduction/ADduction: AAROM;Right;10 reps        Pertinent Vitals/Pain Pain Assessment: 0-10 Pain Score: 2  Pain Location: right knee Pain Descriptors / Indicators: Aching Pain Intervention(s): Limited activity within patient's  tolerance;Monitored during session;Repositioned           PT Goals (current goals can now be found in the care plan section) Acute Rehab PT Goals Patient Stated Goal: to go home at discharge PT Goal Formulation: With patient Time For Goal Achievement: 02/15/15 Potential to Achieve Goals: Good Progress towards PT goals: Progressing toward goals    Frequency  7X/week    PT Plan Current plan remains appropriate       End of Session Equipment Utilized During Treatment: Gait belt;Right knee immobilizer Activity Tolerance: Patient limited by fatigue;Patient limited by pain Patient left: in chair;with call bell/phone within reach;with family/visitor present     Time: 1610-9604 PT Time Calculation (min) (ACUTE ONLY): 25 min  Charges:     1 gait, 1 therapeutic exercise          [place], [place] [phone] office Chase Picket 02/11/2015, 11:50 AM

## 2015-02-11 NOTE — Discharge Summary (Signed)
PATIENT ID: Mark Solomon        MRN:  [id number]          DOB/AGE: Sep 03, 1940 / 75 y.o.    DISCHARGE SUMMARY  ADMISSION DATE:    02/08/2015 DISCHARGE DATE:   02/11/2015   ADMISSION DIAGNOSIS: OA RIGHT KNEE    DISCHARGE DIAGNOSIS:  OA RIGHT KNEE    ADDITIONAL DIAGNOSIS: Active Problems:   Primary localized osteoarthritis of right knee  Past Medical History  Diagnosis Date  . Hypertension   . Hyperlipidemia   . Diastolic dysfunction   . Aortic valve sclerosis   . COPD (chronic obstructive pulmonary disease) (HCC)   . Kidney stone   . Arthritis     PROCEDURE: Procedure(s): TOTAL KNEE ARTHROPLASTY Right on 02/08/2015  CONSULTS: PT/OT      HISTORY:  See H&P in chart  HOSPITAL COURSE:  Mark Solomon is a 75 y.o. admitted on 02/08/2015 and found to have a diagnosis of OA RIGHT KNEE.  After appropriate laboratory studies were obtained  they were taken to the operating room on 02/08/2015 and underwent  Procedure(s): TOTAL KNEE ARTHROPLASTY  Right.   They were given perioperative antibiotics:      Anti-infectives    Start     Dose/Rate Route Frequency Ordered Stop   02/08/15 1800  ceFAZolin (ANCEF) IVPB 1 g/50 mL premix     1 g 100 mL/hr over 30 Minutes Intravenous Every 6 hours 02/08/15 1554 02/09/15 0105   02/08/15 1000  ceFAZolin (ANCEF) IVPB 2 g/50 mL premix     2 g 100 mL/hr over 30 Minutes Intravenous To ShortStay Surgical 02/07/15 0950 02/08/15 1053    .  Tolerated the procedure well.    POD #1, allowed out of bed to a chair.  PT for ambulation and exercise program.  IV saline locked.  O2 discontionued.  POD #2, continued PT and ambulation.  Pt c/o dizziness. . The remainder of the hospital course was dedicated to ambulation and strengthening.   The patient was discharged on 3 Days Post-Op in  Stable condition.  Blood products given:none  DIAGNOSTIC STUDIES: Recent vital signs:  Patient Vitals for the past 24 hrs:  BP Temp Temp src Pulse Resp SpO2   02/11/15 0726 (!) 127/50 mmHg 98.6 F (37 C) Oral (!) 115 18 96 %  02/10/15 2051 (!) 127/57 mmHg 98.9 F (37.2 C) Oral 98 16 93 %  02/10/15 1402 (!) 104/43 mmHg 99 F (37.2 C) Oral 98 17 93 %       Recent laboratory studies:  Recent Labs  02/08/15 2000 02/09/15 0535 02/10/15 0525 02/11/15 0745  WBC 7.3 6.5 6.5 8.3  HGB 14.0 13.0 12.0* 12.1*  HCT 41.4 37.7* 35.7* 34.3*  PLT 121* 120* 107* 142*    Recent Labs  02/08/15 2000 02/09/15 0535  NA  --  139  K  --  4.1  CL  --  105  CO2  --  25  BUN  --  12  CREATININE 0.79 0.82  GLUCOSE  --  135*  CALCIUM  --  8.5*   Lab Results  Component Value Date   INR 1.10 01/28/2015     Recent Radiographic Studies :  Dg Chest 2 View  01/28/2015  CLINICAL DATA:  Total knee arthroplasty EXAM: CHEST  2 VIEW COMPARISON:  None. FINDINGS: The heart size and mediastinal contours are within normal limits. Both lungs are clear. The visualized skeletal structures are unremarkable. IMPRESSION: No active cardiopulmonary  disease. Electronically Signed   By: Elige Ko   On: 01/28/2015 13:42    DISCHARGE INSTRUCTIONS:   DISCHARGE MEDICATIONS:     Medication List    STOP taking these medications        aspirin 81 MG tablet      TAKE these medications        apixaban 2.5 MG Tabs tablet  Commonly known as:  ELIQUIS  Take 1 tablet (2.5 mg total) by mouth 2 (two) times daily.     beclomethasone 40 MCG/ACT inhaler  Commonly known as:  QVAR  Inhale 1 puff into the lungs daily as needed.     COQ10 PO  Take 1 capsule by mouth daily.     ezetimibe-simvastatin 10-40 MG tablet  Commonly known as:  VYTORIN  Take 1 tablet by mouth daily.     fluticasone 50 MCG/ACT nasal spray  Commonly known as:  FLONASE  Place 2 sprays into both nostrils every morning.     HYDROcodone-acetaminophen 5-325 MG tablet  Commonly known as:  NORCO  Take 1 tablet by mouth every 6 (six) hours as needed for severe pain (breakthrough pain).      ketoconazole 2 % shampoo  Commonly known as:  NIZORAL  Apply 1 application topically daily.     losartan 50 MG tablet  Commonly known as:  COZAAR  Take 50 mg by mouth daily.     multivitamin with minerals tablet  Take 1 tablet by mouth daily.     tamsulosin 0.4 MG Caps capsule  Commonly known as:  FLOMAX  Take 0.4 mg by mouth daily.     traMADol 50 MG tablet  Commonly known as:  ULTRAM  1-2 tabs po q6hrs prn pain        FOLLOW UP VISIT:   Follow-up Information    Follow up with CAFFREY JR,W D, MD. Schedule an appointment as soon as possible for a visit in 2 weeks.   Specialty:  Orthopedic Surgery   Contact information:   [address] [place] [address] [phone]       Follow up with [facility].   Why:  They will contact you to schedule home therapy visits.   Contact information:   [address] [place] [address] [phone]       Follow up with Selinda Flavin, MD In 3 days.   Specialty:  Family Medicine   Why:  address low blood pressure need for med?? sometime this week   Contact information:   [address] [place] [address] [phone]       DISPOSITION:   Home  CONDITION:  Stable   Margart Sickles, PA-C  02/11/2015 9:47 AM

## 2015-02-11 NOTE — Care Management Important Message (Signed)
Important Message  Patient Details  Name: Mark Solomon MRN: [id number] Date of Birth: March 25, 1940   Medicare Important Message Given:  Yes    Oralia Rud Shakeila Pfarr 02/11/2015, 3:23 PM

## 2015-02-11 NOTE — Care Management Note (Signed)
Case Management Note  Patient Details  Name: Mark Solomon MRN: [id number] Date of Birth: 01/14/1940  Subjective/Objective:         S/p right total knee arthroplasty           Action/Plan: Set up with [facility] for HHPT by MD office. Spoke with patient, patient's wife and son about discharge, they selected [facility] which they worked with in the past. Naval architect at [employer] and set up HHPT and HHOT. Contacted Draken Farrior at [facility] and cancelled HHPT.Patient already has rolling walker and 3N1 at home and [employer] delivered CPM to home. Family will be available to assist after discharge.   Expected Discharge Date:                  Expected Discharge Plan:  Home w Home Health Services  In-House Referral:  NA  Discharge planning Services  CM Consult  Post Acute Care Choice:  Home Health, Durable Medical Equipment Choice offered to:  Patient  DME Arranged:  CPM DME Agency:  [employer]  HH Arranged:  PT, OT HH Agency:  [facility]  Status of Service:  Completed, signed off  Medicare Important Message Given:    Date Medicare IM Given:    Medicare IM give by:    Date Additional Medicare IM Given:    Additional Medicare Important Message give by:     If discussed at Long Length of Stay Meetings, dates discussed:    Additional Comments:  Monica Becton, RN 02/11/2015, 9:55 AM

## 2015-02-11 NOTE — Progress Notes (Signed)
Pt talking confused at times.Seeing people that arent in rm{hallucinating]

## 2015-02-11 NOTE — Progress Notes (Signed)
Physical Therapy Treatment Patient Details Name: Mark Solomon MRN: [id number] DOB: 1941-01-07 Today's Date: 02/11/2015    History of Present Illness 75 y.o. male admitted to [facility] on 02/08/15 for elective R TKA.  Pt with significant PMHx of HTN, COPD, aortic valve sclerosis, diastolic dysfunction, bil RTC repair, and L TKA (1994).     PT Comments    Pt requires +2 mod assist with RW due to weakness and R knee buckling to transfer from sit to stand with verbal cues for hand placement. Pt completed one step training as he has one step without rails to maneuver when entering his home. Pt verbalized understanding, but once executing the step he hesitated and did not always lead with the correct LE. At end of session, Pt verbalized correct sequence. Family was present for both demonstration and education on completing the stair. Pt plans to d/c home this afternoon.   Follow Up Recommendations  Home health PT;Supervision for mobility/OOB     Equipment Recommendations  None recommended by PT       Precautions / Restrictions Precautions Precautions: Knee;Fall Required Braces or Orthoses: Knee Immobilizer - Right Knee Immobilizer - Right: On when out of bed or walking Restrictions Weight Bearing Restrictions: Yes RLE Weight Bearing: Weight bearing as tolerated    Mobility  Bed Mobility Overal bed mobility:  (Pt received and returned to recliner)                Transfers Overall transfer level: Needs assistance Equipment used: Rolling walker (2 wheeled) Transfers: Sit to/from Stand Sit to Stand: Mod assist;+2 physical assistance         General transfer comment: Pt requires a heavy mod assist to transfer from sitting to standing. Pt has a strong posterior pull/lean until he is upright. Pt requires verbal cue remidners to push up with hands on armrests prior to reaching for RW.   Ambulation/Gait Ambulation/Gait assistance: Min assist;+2 safety/equipment Ambulation Distance  (Feet): 70 Feet Assistive device: Rolling walker (2 wheeled) Gait Pattern/deviations: Step-to pattern;Antalgic;Shuffle Gait velocity: Pt attempts to move too quickly to maintain safety   General Gait Details: Pt requires verbal cues to walk within the base of the walker, to slow down, and to help sequence movement of walker and steps.    Stairs Stairs: Yes Stairs assistance: Min assist Stair Management: No rails;Forwards;With walker Number of Stairs: 1 General stair comments: Pt educated on proper technique for which leg to lead with completing stairs. Pt verbalized understanding; however, once on the stair, Pt hesitated adn did not always move with the correct limb and required correction. Pt needed near continual verbal cueing for sequencing when completing the stair. Pt's family was present and educated on which limb to lead with when ascending versus descending.      Balance Overall balance assessment: Needs assistance Sitting-balance support: No upper extremity supported;Feet supported Sitting balance-Leahy Scale: Good     Standing balance support: Bilateral upper extremity supported Standing balance-Leahy Scale: Poor Standing balance comment: Pt requries min assist to maintain and bilateral UE support to maintain static standing.                    Cognition Arousal/Alertness: Awake/alert Behavior During Therapy: WFL for tasks assessed/performed Overall Cognitive Status: Impaired/Different from baseline Area of Impairment: Following commands;Safety/judgement   Current Attention Level: Selective   Following Commands: Follows one step commands inconsistently Safety/Judgement: Decreased awareness of safety;Decreased awareness of deficits   Problem Solving: Difficulty sequencing;Requires verbal cues;Requires tactile cues  General Comments: Pt continues to require many verbal cues to slow down and walk within the base of the walker. Pt started the walk within the base  of the walker, but quickly begins to push it forward and shuffle step behind.     Exercises Total Joint Exercises Ankle Circles/Pumps: AROM;Both;20 reps Quad Sets: AAROM;Right;10 reps Heel Slides: AAROM;Right;10 reps Hip ABduction/ADduction: AAROM;Right;10 reps Goniometric ROM: R knee flexion: 74 degrees; R knee extension: lacking 8 degrees from full extension          Pertinent Vitals/Pain Pain Assessment: 0-10 Pain Score: 5  Pain Location: right knee Pain Descriptors / Indicators: Aching Pain Intervention(s): Limited activity within patient's tolerance;Monitored during session;Repositioned           PT Goals (current goals can now be found in the care plan section) Acute Rehab PT Goals Patient Stated Goal: to go home at discharge PT Goal Formulation: With patient Time For Goal Achievement: 02/15/15 Potential to Achieve Goals: Good Progress towards PT goals: Progressing toward goals    Frequency  7X/week    PT Plan Current plan remains appropriate       End of Session Equipment Utilized During Treatment: Gait belt;Right knee immobilizer Activity Tolerance: Patient limited by fatigue;Patient limited by pain Patient left: in chair;with family/visitor present;with call bell/phone within reach     Time: 1335-1406 PT Time Calculation (min) (ACUTE ONLY): 31 min  Charges:  1 gait, 1 therapeutic exercise                     [place], [place] [phone] office Chase Picket 02/11/2015, 3:43 PM

## 2015-02-11 NOTE — Progress Notes (Signed)
Discharge ok contacted [facility]

## 2015-02-11 NOTE — Progress Notes (Signed)
Subjective: 3 Days Post-Op Procedure(s) (LRB): TOTAL KNEE ARTHROPLASTY (Right) Patient reports pain as mild.  Family states there is some confusion from baseline.  Objective: Vital signs in last 24 hours: Temp:  [98.6 F (37 C)-99 F (37.2 C)] 98.6 F (37 C) (01/30 0726) Pulse Rate:  [98-115] 115 (01/30 0726) Resp:  [16-18] 18 (01/30 0726) BP: (104-127)/(43-57) 127/50 mmHg (01/30 0726) SpO2:  [93 %-96 %] 96 % (01/30 0726)  Intake/Output from previous day: 01/29 0701 - 01/30 0700 In: 480 [P.O.:480] Out: 200 [Urine:200] Intake/Output this shift:     Recent Labs  02/08/15 2000 02/09/15 0535 02/10/15 0525 02/11/15 0745  HGB 14.0 13.0 12.0* 12.1*    Recent Labs  02/10/15 0525 02/11/15 0745  WBC 6.5 8.3  RBC 3.73* 3.69*  HCT 35.7* 34.3*  PLT 107* 142*    Recent Labs  02/08/15 2000 02/09/15 0535  NA  --  139  K  --  4.1  CL  --  105  CO2  --  25  BUN  --  12  CREATININE 0.79 0.82  GLUCOSE  --  135*  CALCIUM  --  8.5*   No results for input(s): LABPT, INR in the last 72 hours.  Neurovascular intact Sensation intact distally Intact pulses distally Dorsiflexion/Plantar flexion intact Incision: dressing C/D/I No cellulitis present  Alert, oriented to person, month, but not place or day  Assessment/Plan: 3 Days Post-Op Procedure(s) (LRB): TOTAL KNEE ARTHROPLASTY (Right) Up with therapy  Likely d/c home today with family supervision and hhpt D/c narcotics ordered tramadol prn   Mark Solomon 02/11/2015, 9:19 AM

## 2015-02-11 NOTE — Progress Notes (Signed)
Occupational Therapy Treatment/Discharge Patient Details Name: Mark Solomon MRN: [id number] DOB: 1940-03-27 Today's Date: 02/11/2015    History of present illness 75 y.o. male admitted to [facility] on 02/08/15 for elective R TKA.  Pt with significant PMHx of HTN, COPD, aortic valve sclerosis, diastolic dysfunction, bil RTC repair, and L TKA (1994).    OT comments  Pt making good progress toward occupational therapy goals. Pt completed all functional transfers and LB ADLs with min guard assist. Pt still demonstrated impulsive and restless behavior this session and attempted to stand up without RW x3 claiming that he was going home despite cues from family, therapist, and PA that he would not leaving until this afternoon. Pt also required mod verbal cues for sequencing and safety during ADLs to which therapist advised pt's wife to maintain close supervision during these tasks at home. Educated pt and family on fall prevention, energy conservation, pain/edema management, and KI and 0 bone foam wear protocol. All education has been completed and pt has no further questions. Pt with no further acute OT needs. OT signing off.   Follow Up Recommendations  Home health OT;Supervision/Assistance - 24 hour    Equipment Recommendations  None recommended by OT    Recommendations for Other Services      Precautions / Restrictions Precautions Precautions: Knee;Fall Precaution Booklet Issued: No Precaution Comments: Reviewed precautions and use of KI and 0 bone foam Required Braces or Orthoses: Knee Immobilizer - Right Knee Immobilizer - Right: Other (comment) (PA discontinued use of KI) Restrictions Weight Bearing Restrictions: Yes RLE Weight Bearing: Weight bearing as tolerated       Mobility Bed Mobility               General bed mobility comments: Pt up in chair on OT arrival  Transfers Overall transfer level: Needs assistance Equipment used: Rolling walker (2 wheeled) Transfers: Sit  to/from Stand Sit to Stand: Min guard         General transfer comment: Min guard assist for balance upon standing. Pt demonstrating good hand placement upon standing, but needed verbal cues to reach back for surfaces before sitting. Verbal cues x3 to have RW before attempting to stand - pt continues to demonstrate impulsivity    Balance Overall balance assessment: Needs assistance Sitting-balance support: No upper extremity supported;Feet supported Sitting balance-Leahy Scale: Good     Standing balance support: Bilateral upper extremity supported;During functional activity Standing balance-Leahy Scale: Poor Standing balance comment: Pt requires UE support from RW or other DME during transfers to maintain balance.                   ADL Overall ADL's : Needs assistance/impaired             Lower Body Bathing: Min guard;Sit to/from stand;Cueing for compensatory techniques       Lower Body Dressing: Set up;Sit to/from stand;With caregiver independent assisting   Toilet Transfer: Min guard;Cueing for sequencing;Cueing for safety;Ambulation;Comfort height toilet;Grab bars;RW Statistician Details (indicate cue type and reason): Verbal cues to back up to toilet with RW, safe hand placement on grabbars or BSC handles, and to feel toilet on back of legs before attempting to sit Toileting- Clothing Manipulation and Hygiene: Min guard;Sit to/from stand   Tub/ Shower Transfer: Walk-in shower;Min guard;Cueing for sequencing;Ambulation;Rolling walker Tub/Shower Transfer Details (indicate cue type and reason): Verbal cues for proper step sequence with RW. On teachback, pt unable to recall proper sequence. Functional mobility during ADLs: Min guard;Cueing for safety;Rolling  walker General ADL Comments: Pt was restless and impulsive during session. Pt attempted x3 to walk without RW claiming he was going home despite cues from therapist, PA, and family that he was not leaving yet.  Reviewed compenastory strategies for bathing/dressing, KI and 0 bone foam wear protocol, pain/edema management, fall prevention, and gradually increasing activity level. Discussed with pt's wife need for close supervision with ADLs and mobility - pt's wife concerned that she cannot lift pt if needed, but assured her that pt had gained strength and really only required close guarding due to R knee buckling. Pt still very unsteady on his feet (especially initially which he recognizes) but pt had no overt LOB.      Vision                     Perception     Praxis      Cognition   Behavior During Therapy: Restless;Impulsive Overall Cognitive Status: Impaired/Different from baseline Area of Impairment: Attention;Following commands;Safety/judgement;Problem solving;Memory   Current Attention Level: Selective Memory: Decreased short-term memory  Following Commands: Follows one step commands with increased time;Follows one step commands inconsistently Safety/Judgement: Decreased awareness of safety;Decreased awareness of deficits   Problem Solving: Slow processing;Requires verbal cues;Difficulty sequencing General Comments: Still demonstrated difficulty with directional cues. Required cues to maintain attention x6 during session. Attempted to get up with RW to leave after PA told pt he would not be leaving until later this afternoon.    Extremity/Trunk Assessment               Exercises     Shoulder Instructions       General Comments      Pertinent Vitals/ Pain       Pain Assessment: 0-10 Pain Score: 2  Pain Location: R knee Pain Descriptors / Indicators: Sore Pain Intervention(s): Limited activity within patient's tolerance;Monitored during session;Repositioned;Ice applied  Home Living                                          Prior Functioning/Environment              Frequency       Progress Toward Goals  OT Goals(current goals can  now be found in the care plan section)  Progress towards OT goals: Goals met/education completed, patient discharged from OT  Acute Rehab OT Goals Patient Stated Goal: to go home at discharge OT Goal Formulation: With patient/family Time For Goal Achievement: 02/23/15 Potential to Achieve Goals: Good ADL Goals Pt Will Perform Grooming: with min guard assist;standing Pt Will Transfer to Toilet: with min guard assist;ambulating;bedside commode Pt Will Perform Toileting - Clothing Manipulation and hygiene: with min guard assist;sit to/from stand Pt Will Perform Tub/Shower Transfer: Shower transfer;with min guard assist;ambulating;shower seat;rolling walker  Plan All goals met and education completed, patient discharged from OT services    Co-evaluation                 End of Session Equipment Utilized During Treatment: Gait belt;Rolling walker;Right knee immobilizer CPM Right Knee CPM Right Knee: Off   Activity Tolerance Patient tolerated treatment well   Patient Left in chair;with call bell/phone within reach;with family/visitor present;with chair alarm set;Other (comment) (0 bone foam applied to RLE)   Nurse Communication Mobility status        Time: 9629-5284 OT Time Calculation (min): 37 min  Charges: OT General Charges $OT Visit: 1 Procedure OT Treatments $Self Care/Home Management : 23-37 mins  Redmond Baseman, OTR/L Pager: [phone] 02/11/2015, 9:38 AM

## 2015-02-11 NOTE — Discharge Instructions (Signed)
INSTRUCTIONS AFTER JOINT REPLACEMENT  ° °o Remove items at home which could result in a fall. This includes throw rugs or furniture in walking pathways °o ICE to the affected joint every three hours while awake for 30 minutes at a time, for at least the first 3-5 days, and then as needed for pain and swelling.  Continue to use ice for pain and swelling. You may notice swelling that will progress down to the foot and ankle.  This is normal after surgery.  Elevate your leg when you are not up walking on it.   °o Continue to use the breathing machine you got in the hospital (incentive spirometer) which will help keep your temperature down.  It is common for your temperature to cycle up and down following surgery, especially at night when you are not up moving around and exerting yourself.  The breathing machine keeps your lungs expanded and your temperature down. ° ° °DIET:  As you were doing prior to hospitalization, we recommend a well-balanced diet. ° °DRESSING / WOUND CARE / SHOWERING ° °You may change your dressing 3-5 days after surgery.  Then change the dressing every day with sterile gauze.  Please use good hand washing techniques before changing the dressing.  Do not use any lotions or creams on the incision until instructed by your surgeon. ° °ACTIVITY ° °o Increase activity slowly as tolerated, but follow the weight bearing instructions below.   °o No driving for 6 weeks or until further direction given by your physician.  You cannot drive while taking narcotics.  °o No lifting or carrying greater than 10 lbs. until further directed by your surgeon. °o Avoid periods of inactivity such as sitting longer than an hour when not asleep. This helps prevent blood clots.  °o You may return to work once you are authorized by your doctor.  ° ° ° °WEIGHT BEARING  ° °Weight bearing as tolerated with assist device (walker, cane, etc) as directed, use it as long as suggested by your surgeon or therapist, typically at  least 4-6 weeks. ° ° °EXERCISES ° °Results after joint replacement surgery are often greatly improved when you follow the exercise, range of motion and muscle strengthening exercises prescribed by your doctor. Safety measures are also important to protect the joint from further injury. Any time any of these exercises cause you to have increased pain or swelling, decrease what you are doing until you are comfortable again and then slowly increase them. If you have problems or questions, call your caregiver or physical therapist for advice.  ° °Rehabilitation is important following a joint replacement. After just a few days of immobilization, the muscles of the leg can become weakened and shrink (atrophy).  These exercises are designed to build up the tone and strength of the thigh and leg muscles and to improve motion. Often times heat used for twenty to thirty minutes before working out will loosen up your tissues and help with improving the range of motion but do not use heat for the first two weeks following surgery (sometimes heat can increase post-operative swelling).  ° °These exercises can be done on a training (exercise) mat, on the floor, on a table or on a bed. Use whatever works the best and is most comfortable for you.    Use music or television while you are exercising so that the exercises are a pleasant break in your day. This will make your life better with the exercises acting as a break   in your routine that you can look forward to.   Perform all exercises about fifteen times, three times per day or as directed.  You should exercise both the operative leg and the other leg as well. ° °Exercises include: °  °• Quad Sets - Tighten up the muscle on the front of the thigh (Quad) and hold for 5-10 seconds.   °• Straight Leg Raises - With your knee straight (if you were given a brace, keep it on), lift the leg to 60 degrees, hold for 3 seconds, and slowly lower the leg.  Perform this exercise against  resistance later as your leg gets stronger.  °• Leg Slides: Lying on your back, slowly slide your foot toward your buttocks, bending your knee up off the floor (only go as far as is comfortable). Then slowly slide your foot back down until your leg is flat on the floor again.  °• Angel Wings: Lying on your back spread your legs to the side as far apart as you can without causing discomfort.  °• Hamstring Strength:  Lying on your back, push your heel against the floor with your leg straight by tightening up the muscles of your buttocks.  Repeat, but this time bend your knee to a comfortable angle, and push your heel against the floor.  You may put a pillow under the heel to make it more comfortable if necessary.  ° °A rehabilitation program following joint replacement surgery can speed recovery and prevent re-injury in the future due to weakened muscles. Contact your doctor or a physical therapist for more information on knee rehabilitation.  ° ° °CONSTIPATION ° °Constipation is defined medically as fewer than three stools per week and severe constipation as less than one stool per week.  Even if you have a regular bowel pattern at home, your normal regimen is likely to be disrupted due to multiple reasons following surgery.  Combination of anesthesia, postoperative narcotics, change in appetite and fluid intake all can affect your bowels.  ° °YOU MUST use at least one of the following options; they are listed in order of increasing strength to get the job done.  They are all available over the counter, and you may need to use some, POSSIBLY even all of these options:   ° °Drink plenty of fluids (prune juice may be helpful) and high fiber foods °Colace 100 mg by mouth twice a day  °Senokot for constipation as directed and as needed Dulcolax (bisacodyl), take with full glass of water  °Miralax (polyethylene glycol) once or twice a day as needed. ° °If you have tried all these things and are unable to have a bowel  movement in the first 3-4 days after surgery call either your surgeon or your primary doctor.   ° °If you experience loose stools or diarrhea, hold the medications until you stool forms back up.  If your symptoms do not get better within 1 week or if they get worse, check with your doctor.  If you experience "the worst abdominal pain ever" or develop nausea or vomiting, please contact the office immediately for further recommendations for treatment. ° ° °ITCHING:  If you experience itching with your medications, try taking only a single pain pill, or even half a pain pill at a time.  You can also use Benadryl over the counter for itching or also to help with sleep.  ° °TED HOSE STOCKINGS:  Use stockings on both legs until for at least 2 weeks or as   directed by physician office. They may be removed at night for sleeping.  MEDICATIONS:  See your medication summary on the After Visit Summary that nursing will review with you.  You may have some home medications which will be placed on hold until you complete the course of blood thinner medication.  It is important for you to complete the blood thinner medication as prescribed.  PRECAUTIONS:  If you experience chest pain or shortness of breath - call 911 immediately for transfer to the hospital emergency department.   If you develop a fever greater that 101 F, purulent drainage from wound, increased redness or drainage from wound, foul odor from the wound/dressing, or calf pain - CONTACT YOUR SURGEON.                                                   FOLLOW-UP APPOINTMENTS:  If you do not already have a post-op appointment, please call the office for an appointment to be seen by your surgeon.  Guidelines for how soon to be seen are listed in your After Visit Summary, but are typically between 1-4 weeks after surgery.  OTHER INSTRUCTIONS:   Knee Replacement:  Do not place pillow under knee, focus on keeping the knee straight while resting. CPM  instructions: 0-90 degrees, 2 hours in the morning, 2 hours in the afternoon, and 2 hours in the evening. Place foam block, curve side up under heel at all times except when in CPM or when walking.  DO NOT modify, tear, cut, or change the foam block in any way.  MAKE SURE YOU:   Understand these instructions.   Get help right away if you are not doing well or get worse.    Thank you for letting us be a part of your medical care team.  It is a privilege we respect greatly.  We hope these instructions will help you stay on track for a fast and full recovery!  Information on my medicine - ELIQUIS (apixaban)  Why was Eliquis prescribed for you? Eliquis was prescribed for you to reduce the risk of blood clots forming after orthopedic surgery.    What do You need to know about Eliquis? Take your Eliquis TWICE DAILY - one tablet in the morning and one tablet in the evening with or without food.  It would be best to take the dose about the same time each day.  If you have difficulty swallowing the tablet whole please discuss with your pharmacist how to take the medication safely.  Take Eliquis exactly as prescribed by your doctor and DO NOT stop taking Eliquis without talking to the doctor who prescribed the medication.  Stopping without other medication to take the place of Eliquis may increase your risk of developing a clot.  After discharge, you should have regular check-up appointments with your healthcare provider that is prescribing your Eliquis.  What do you do if you miss a dose? If a dose of ELIQUIS is not taken at the scheduled time, take it as soon as possible on the same day and twice-daily administration should be resumed.  The dose should not be doubled to make up for a missed dose.  Do not take more than one tablet of ELIQUIS at the same time.  Important Safety Information A possible side effect of Eliquis is bleeding. You should call  your healthcare provider right away  if you experience any of the following: ? Bleeding from an injury or your nose that does not stop. ? Unusual colored urine (red or dark brown) or unusual colored stools (red or black). ? Unusual bruising for unknown reasons. ? A serious fall or if you hit your head (even if there is no bleeding).  Some medicines may interact with Eliquis and might increase your risk of bleeding or clotting while on Eliquis. To help avoid this, consult your healthcare provider or pharmacist prior to using any new prescription or non-prescription medications, including herbals, vitamins, non-steroidal anti-inflammatory drugs (NSAIDs) and supplements.  This website has more information on Eliquis (apixaban): http://www.eliquis.com/eliquis/homeInformation on my medicine - ELIQUIS (apixaban)  This medication education was reviewed with me or my healthcare representative as part of my discharge preparation.  The pharmacist that spoke with me during my hospital stay was:  Herby Abraham, [facility]  Why was Eliquis prescribed for you? Eliquis was prescribed for you to reduce the risk of blood clots forming after orthopedic surgery.    What do You need to know about Eliquis? Take your Eliquis TWICE DAILY - one tablet in the morning and one tablet in the evening with or without food.  It would be best to take the dose about the same time each day.  If you have difficulty swallowing the tablet whole please discuss with your pharmacist how to take the medication safely.  Take Eliquis exactly as prescribed by your doctor and DO NOT stop taking Eliquis without talking to the doctor who prescribed the medication.  Stopping without other medication to take the place of Eliquis may increase your risk of developing a clot.  After discharge, you should have regular check-up appointments with your healthcare provider that is prescribing your Eliquis.  What do you do if you miss a dose? If a dose of ELIQUIS is not taken  at the scheduled time, take it as soon as possible on the same day and twice-daily administration should be resumed.  The dose should not be doubled to make up for a missed dose.  Do not take more than one tablet of ELIQUIS at the same time.  Important Safety Information A possible side effect of Eliquis is bleeding. You should call your healthcare provider right away if you experience any of the following: ? Bleeding from an injury or your nose that does not stop. ? Unusual colored urine (red or dark brown) or unusual colored stools (red or black). ? Unusual bruising for unknown reasons. ? A serious fall or if you hit your head (even if there is no bleeding).  Some medicines may interact with Eliquis and might increase your risk of bleeding or clotting while on Eliquis. To help avoid this, consult your healthcare provider or pharmacist prior to using any new prescription or non-prescription medications, including herbals, vitamins, non-steroidal anti-inflammatory drugs (NSAIDs) and supplements.  This website has more information on Eliquis (apixaban): http://www.eliquis.com/eliquis/homeInformation on my medicine - ELIQUIS (apixaban)  This medication education was reviewed with me or my healthcare representative as part of my discharge preparation.  The pharmacist that spoke with me during my hospital stay was:  Herby Abraham, [facility]  Why was Eliquis prescribed for you? Eliquis was prescribed for you to reduce the risk of blood clots forming after orthopedic surgery.    What do You need to know about Eliquis? Take your Eliquis TWICE DAILY - one tablet in the morning and one tablet  in the evening with or without food.  It would be best to take the dose about the same time each day.  If you have difficulty swallowing the tablet whole please discuss with your pharmacist how to take the medication safely.  Take Eliquis exactly as prescribed by your doctor and DO NOT stop taking Eliquis  without talking to the doctor who prescribed the medication.  Stopping without other medication to take the place of Eliquis may increase your risk of developing a clot.  After discharge, you should have regular check-up appointments with your healthcare provider that is prescribing your Eliquis.  What do you do if you miss a dose? If a dose of ELIQUIS is not taken at the scheduled time, take it as soon as possible on the same day and twice-daily administration should be resumed.  The dose should not be doubled to make up for a missed dose.  Do not take more than one tablet of ELIQUIS at the same time.  Important Safety Information A possible side effect of Eliquis is bleeding. You should call your healthcare provider right away if you experience any of the following: ? Bleeding from an injury or your nose that does not stop. ? Unusual colored urine (red or dark brown) or unusual colored stools (red or black). ? Unusual bruising for unknown reasons. ? A serious fall or if you hit your head (even if there is no bleeding).  Some medicines may interact with Eliquis and might increase your risk of bleeding or clotting while on Eliquis. To help avoid this, consult your healthcare provider or pharmacist prior to using any new prescription or non-prescription medications, including herbals, vitamins, non-steroidal anti-inflammatory drugs (NSAIDs) and supplements.  This website has more information on Eliquis (apixaban): http://www.eliquis.com/eliquis/home

## 2015-02-12 DIAGNOSIS — I10 Essential (primary) hypertension: Secondary | ICD-10-CM | POA: Diagnosis not present

## 2015-02-12 DIAGNOSIS — I5030 Unspecified diastolic (congestive) heart failure: Secondary | ICD-10-CM | POA: Diagnosis not present

## 2015-02-12 DIAGNOSIS — Z96651 Presence of right artificial knee joint: Secondary | ICD-10-CM | POA: Diagnosis not present

## 2015-02-12 DIAGNOSIS — I350 Nonrheumatic aortic (valve) stenosis: Secondary | ICD-10-CM | POA: Diagnosis not present

## 2015-02-12 DIAGNOSIS — Z7901 Long term (current) use of anticoagulants: Secondary | ICD-10-CM | POA: Diagnosis not present

## 2015-02-12 DIAGNOSIS — Z471 Aftercare following joint replacement surgery: Secondary | ICD-10-CM | POA: Diagnosis not present

## 2015-02-13 DIAGNOSIS — Z471 Aftercare following joint replacement surgery: Secondary | ICD-10-CM | POA: Diagnosis not present

## 2015-02-13 DIAGNOSIS — I350 Nonrheumatic aortic (valve) stenosis: Secondary | ICD-10-CM | POA: Diagnosis not present

## 2015-02-13 DIAGNOSIS — Z96651 Presence of right artificial knee joint: Secondary | ICD-10-CM | POA: Diagnosis not present

## 2015-02-13 DIAGNOSIS — I10 Essential (primary) hypertension: Secondary | ICD-10-CM | POA: Diagnosis not present

## 2015-02-13 DIAGNOSIS — I5030 Unspecified diastolic (congestive) heart failure: Secondary | ICD-10-CM | POA: Diagnosis not present

## 2015-02-13 DIAGNOSIS — M1711 Unilateral primary osteoarthritis, right knee: Secondary | ICD-10-CM | POA: Diagnosis not present

## 2015-02-13 DIAGNOSIS — Z7901 Long term (current) use of anticoagulants: Secondary | ICD-10-CM | POA: Diagnosis not present

## 2015-02-15 DIAGNOSIS — I350 Nonrheumatic aortic (valve) stenosis: Secondary | ICD-10-CM | POA: Diagnosis not present

## 2015-02-15 DIAGNOSIS — Z96651 Presence of right artificial knee joint: Secondary | ICD-10-CM | POA: Diagnosis not present

## 2015-02-15 DIAGNOSIS — Z7901 Long term (current) use of anticoagulants: Secondary | ICD-10-CM | POA: Diagnosis not present

## 2015-02-15 DIAGNOSIS — Z471 Aftercare following joint replacement surgery: Secondary | ICD-10-CM | POA: Diagnosis not present

## 2015-02-15 DIAGNOSIS — I10 Essential (primary) hypertension: Secondary | ICD-10-CM | POA: Diagnosis not present

## 2015-02-15 DIAGNOSIS — I5030 Unspecified diastolic (congestive) heart failure: Secondary | ICD-10-CM | POA: Diagnosis not present

## 2015-02-18 DIAGNOSIS — I10 Essential (primary) hypertension: Secondary | ICD-10-CM | POA: Diagnosis not present

## 2015-02-18 DIAGNOSIS — Z7901 Long term (current) use of anticoagulants: Secondary | ICD-10-CM | POA: Diagnosis not present

## 2015-02-18 DIAGNOSIS — Z471 Aftercare following joint replacement surgery: Secondary | ICD-10-CM | POA: Diagnosis not present

## 2015-02-18 DIAGNOSIS — I5030 Unspecified diastolic (congestive) heart failure: Secondary | ICD-10-CM | POA: Diagnosis not present

## 2015-02-18 DIAGNOSIS — I350 Nonrheumatic aortic (valve) stenosis: Secondary | ICD-10-CM | POA: Diagnosis not present

## 2015-02-18 DIAGNOSIS — Z96651 Presence of right artificial knee joint: Secondary | ICD-10-CM | POA: Diagnosis not present

## 2015-02-20 DIAGNOSIS — Z7901 Long term (current) use of anticoagulants: Secondary | ICD-10-CM | POA: Diagnosis not present

## 2015-02-20 DIAGNOSIS — Z96651 Presence of right artificial knee joint: Secondary | ICD-10-CM | POA: Diagnosis not present

## 2015-02-20 DIAGNOSIS — Z471 Aftercare following joint replacement surgery: Secondary | ICD-10-CM | POA: Diagnosis not present

## 2015-02-20 DIAGNOSIS — I5030 Unspecified diastolic (congestive) heart failure: Secondary | ICD-10-CM | POA: Diagnosis not present

## 2015-02-20 DIAGNOSIS — I10 Essential (primary) hypertension: Secondary | ICD-10-CM | POA: Diagnosis not present

## 2015-02-20 DIAGNOSIS — I350 Nonrheumatic aortic (valve) stenosis: Secondary | ICD-10-CM | POA: Diagnosis not present

## 2015-02-22 DIAGNOSIS — Z7901 Long term (current) use of anticoagulants: Secondary | ICD-10-CM | POA: Diagnosis not present

## 2015-02-22 DIAGNOSIS — I5030 Unspecified diastolic (congestive) heart failure: Secondary | ICD-10-CM | POA: Diagnosis not present

## 2015-02-22 DIAGNOSIS — I350 Nonrheumatic aortic (valve) stenosis: Secondary | ICD-10-CM | POA: Diagnosis not present

## 2015-02-22 DIAGNOSIS — I10 Essential (primary) hypertension: Secondary | ICD-10-CM | POA: Diagnosis not present

## 2015-02-22 DIAGNOSIS — Z96651 Presence of right artificial knee joint: Secondary | ICD-10-CM | POA: Diagnosis not present

## 2015-02-22 DIAGNOSIS — Z471 Aftercare following joint replacement surgery: Secondary | ICD-10-CM | POA: Diagnosis not present

## 2015-02-22 DIAGNOSIS — M170 Bilateral primary osteoarthritis of knee: Secondary | ICD-10-CM | POA: Diagnosis not present

## 2015-02-25 DIAGNOSIS — M25561 Pain in right knee: Secondary | ICD-10-CM | POA: Diagnosis not present

## 2015-02-25 DIAGNOSIS — R262 Difficulty in walking, not elsewhere classified: Secondary | ICD-10-CM | POA: Diagnosis not present

## 2015-02-28 DIAGNOSIS — M25561 Pain in right knee: Secondary | ICD-10-CM | POA: Diagnosis not present

## 2015-02-28 DIAGNOSIS — R262 Difficulty in walking, not elsewhere classified: Secondary | ICD-10-CM | POA: Diagnosis not present

## 2015-03-05 DIAGNOSIS — M25561 Pain in right knee: Secondary | ICD-10-CM | POA: Diagnosis not present

## 2015-03-05 DIAGNOSIS — R262 Difficulty in walking, not elsewhere classified: Secondary | ICD-10-CM | POA: Diagnosis not present

## 2015-03-07 DIAGNOSIS — M25561 Pain in right knee: Secondary | ICD-10-CM | POA: Diagnosis not present

## 2015-03-07 DIAGNOSIS — R262 Difficulty in walking, not elsewhere classified: Secondary | ICD-10-CM | POA: Diagnosis not present

## 2015-03-12 DIAGNOSIS — R262 Difficulty in walking, not elsewhere classified: Secondary | ICD-10-CM | POA: Diagnosis not present

## 2015-03-12 DIAGNOSIS — M25561 Pain in right knee: Secondary | ICD-10-CM | POA: Diagnosis not present

## 2015-03-14 DIAGNOSIS — M25561 Pain in right knee: Secondary | ICD-10-CM | POA: Diagnosis not present

## 2015-03-14 DIAGNOSIS — R262 Difficulty in walking, not elsewhere classified: Secondary | ICD-10-CM | POA: Diagnosis not present

## 2015-03-19 DIAGNOSIS — M25561 Pain in right knee: Secondary | ICD-10-CM | POA: Diagnosis not present

## 2015-03-19 DIAGNOSIS — R262 Difficulty in walking, not elsewhere classified: Secondary | ICD-10-CM | POA: Diagnosis not present

## 2015-03-21 DIAGNOSIS — M25561 Pain in right knee: Secondary | ICD-10-CM | POA: Diagnosis not present

## 2015-03-21 DIAGNOSIS — R262 Difficulty in walking, not elsewhere classified: Secondary | ICD-10-CM | POA: Diagnosis not present

## 2015-03-22 DIAGNOSIS — M25511 Pain in right shoulder: Secondary | ICD-10-CM | POA: Diagnosis not present

## 2015-04-29 DIAGNOSIS — Z13220 Encounter for screening for lipoid disorders: Secondary | ICD-10-CM | POA: Diagnosis not present

## 2015-04-29 DIAGNOSIS — I10 Essential (primary) hypertension: Secondary | ICD-10-CM | POA: Diagnosis not present

## 2015-04-29 DIAGNOSIS — J440 Chronic obstructive pulmonary disease with acute lower respiratory infection: Secondary | ICD-10-CM | POA: Diagnosis not present

## 2015-04-29 DIAGNOSIS — E7800 Pure hypercholesterolemia, unspecified: Secondary | ICD-10-CM | POA: Diagnosis not present

## 2015-05-06 DIAGNOSIS — Z0000 Encounter for general adult medical examination without abnormal findings: Secondary | ICD-10-CM | POA: Diagnosis not present

## 2015-05-06 DIAGNOSIS — N020 Recurrent and persistent hematuria with minor glomerular abnormality: Secondary | ICD-10-CM | POA: Diagnosis not present

## 2015-06-17 DIAGNOSIS — N200 Calculus of kidney: Secondary | ICD-10-CM | POA: Diagnosis not present

## 2015-06-20 DIAGNOSIS — R3915 Urgency of urination: Secondary | ICD-10-CM | POA: Diagnosis not present

## 2015-06-20 DIAGNOSIS — N200 Calculus of kidney: Secondary | ICD-10-CM | POA: Diagnosis not present

## 2015-06-22 DIAGNOSIS — R1310 Dysphagia, unspecified: Secondary | ICD-10-CM | POA: Diagnosis not present

## 2015-06-22 DIAGNOSIS — T17220A Food in pharynx causing asphyxiation, initial encounter: Secondary | ICD-10-CM | POA: Diagnosis not present

## 2015-06-22 DIAGNOSIS — E7800 Pure hypercholesterolemia, unspecified: Secondary | ICD-10-CM | POA: Diagnosis not present

## 2015-06-22 DIAGNOSIS — Z79899 Other long term (current) drug therapy: Secondary | ICD-10-CM | POA: Diagnosis not present

## 2015-06-22 DIAGNOSIS — T17320A Food in larynx causing asphyxiation, initial encounter: Secondary | ICD-10-CM | POA: Diagnosis not present

## 2015-06-22 DIAGNOSIS — R1111 Vomiting without nausea: Secondary | ICD-10-CM | POA: Diagnosis not present

## 2015-06-22 DIAGNOSIS — J988 Other specified respiratory disorders: Secondary | ICD-10-CM | POA: Diagnosis not present

## 2015-06-22 DIAGNOSIS — Z87891 Personal history of nicotine dependence: Secondary | ICD-10-CM | POA: Diagnosis not present

## 2015-06-22 DIAGNOSIS — I10 Essential (primary) hypertension: Secondary | ICD-10-CM | POA: Diagnosis not present

## 2015-07-10 DIAGNOSIS — H2513 Age-related nuclear cataract, bilateral: Secondary | ICD-10-CM | POA: Diagnosis not present

## 2015-07-10 DIAGNOSIS — H538 Other visual disturbances: Secondary | ICD-10-CM | POA: Diagnosis not present

## 2015-08-01 DIAGNOSIS — H538 Other visual disturbances: Secondary | ICD-10-CM | POA: Diagnosis not present

## 2015-08-01 DIAGNOSIS — H2511 Age-related nuclear cataract, right eye: Secondary | ICD-10-CM | POA: Diagnosis not present

## 2015-08-05 DIAGNOSIS — J449 Chronic obstructive pulmonary disease, unspecified: Secondary | ICD-10-CM | POA: Diagnosis not present

## 2015-08-05 DIAGNOSIS — Z79899 Other long term (current) drug therapy: Secondary | ICD-10-CM | POA: Diagnosis not present

## 2015-08-05 DIAGNOSIS — E7800 Pure hypercholesterolemia, unspecified: Secondary | ICD-10-CM | POA: Diagnosis not present

## 2015-08-05 DIAGNOSIS — H5200 Hypermetropia, unspecified eye: Secondary | ICD-10-CM | POA: Diagnosis not present

## 2015-08-05 DIAGNOSIS — H538 Other visual disturbances: Secondary | ICD-10-CM | POA: Diagnosis not present

## 2015-08-05 DIAGNOSIS — H269 Unspecified cataract: Secondary | ICD-10-CM | POA: Diagnosis not present

## 2015-08-05 DIAGNOSIS — H2513 Age-related nuclear cataract, bilateral: Secondary | ICD-10-CM | POA: Diagnosis not present

## 2015-08-05 DIAGNOSIS — H52209 Unspecified astigmatism, unspecified eye: Secondary | ICD-10-CM | POA: Diagnosis not present

## 2015-08-05 DIAGNOSIS — Z7951 Long term (current) use of inhaled steroids: Secondary | ICD-10-CM | POA: Diagnosis not present

## 2015-08-05 DIAGNOSIS — I10 Essential (primary) hypertension: Secondary | ICD-10-CM | POA: Diagnosis not present

## 2015-08-05 DIAGNOSIS — E785 Hyperlipidemia, unspecified: Secondary | ICD-10-CM | POA: Diagnosis not present

## 2015-08-05 DIAGNOSIS — H524 Presbyopia: Secondary | ICD-10-CM | POA: Diagnosis not present

## 2015-08-05 DIAGNOSIS — Z7982 Long term (current) use of aspirin: Secondary | ICD-10-CM | POA: Diagnosis not present

## 2015-08-05 DIAGNOSIS — H2511 Age-related nuclear cataract, right eye: Secondary | ICD-10-CM | POA: Diagnosis not present

## 2015-08-09 DIAGNOSIS — M25511 Pain in right shoulder: Secondary | ICD-10-CM | POA: Diagnosis not present

## 2015-08-09 DIAGNOSIS — M1711 Unilateral primary osteoarthritis, right knee: Secondary | ICD-10-CM | POA: Diagnosis not present

## 2015-09-19 DIAGNOSIS — H43391 Other vitreous opacities, right eye: Secondary | ICD-10-CM | POA: Diagnosis not present

## 2015-09-26 DIAGNOSIS — H2512 Age-related nuclear cataract, left eye: Secondary | ICD-10-CM | POA: Diagnosis not present

## 2015-09-26 DIAGNOSIS — H538 Other visual disturbances: Secondary | ICD-10-CM | POA: Diagnosis not present

## 2015-10-01 DIAGNOSIS — H538 Other visual disturbances: Secondary | ICD-10-CM | POA: Diagnosis not present

## 2015-10-01 DIAGNOSIS — Z7982 Long term (current) use of aspirin: Secondary | ICD-10-CM | POA: Diagnosis not present

## 2015-10-01 DIAGNOSIS — J449 Chronic obstructive pulmonary disease, unspecified: Secondary | ICD-10-CM | POA: Diagnosis not present

## 2015-10-01 DIAGNOSIS — H5200 Hypermetropia, unspecified eye: Secondary | ICD-10-CM | POA: Diagnosis not present

## 2015-10-01 DIAGNOSIS — N400 Enlarged prostate without lower urinary tract symptoms: Secondary | ICD-10-CM | POA: Diagnosis not present

## 2015-10-01 DIAGNOSIS — I10 Essential (primary) hypertension: Secondary | ICD-10-CM | POA: Diagnosis not present

## 2015-10-01 DIAGNOSIS — H43391 Other vitreous opacities, right eye: Secondary | ICD-10-CM | POA: Diagnosis not present

## 2015-10-01 DIAGNOSIS — Z79899 Other long term (current) drug therapy: Secondary | ICD-10-CM | POA: Diagnosis not present

## 2015-10-01 DIAGNOSIS — H269 Unspecified cataract: Secondary | ICD-10-CM | POA: Diagnosis not present

## 2015-10-01 DIAGNOSIS — Z7951 Long term (current) use of inhaled steroids: Secondary | ICD-10-CM | POA: Diagnosis not present

## 2015-10-01 DIAGNOSIS — H52209 Unspecified astigmatism, unspecified eye: Secondary | ICD-10-CM | POA: Diagnosis not present

## 2015-10-01 DIAGNOSIS — Z961 Presence of intraocular lens: Secondary | ICD-10-CM | POA: Diagnosis not present

## 2015-10-01 DIAGNOSIS — H2512 Age-related nuclear cataract, left eye: Secondary | ICD-10-CM | POA: Diagnosis not present

## 2015-10-01 DIAGNOSIS — H524 Presbyopia: Secondary | ICD-10-CM | POA: Diagnosis not present

## 2015-12-02 DIAGNOSIS — M25579 Pain in unspecified ankle and joints of unspecified foot: Secondary | ICD-10-CM | POA: Diagnosis not present

## 2015-12-02 DIAGNOSIS — M79672 Pain in left foot: Secondary | ICD-10-CM | POA: Diagnosis not present

## 2016-01-27 DIAGNOSIS — J0101 Acute recurrent maxillary sinusitis: Secondary | ICD-10-CM | POA: Diagnosis not present

## 2016-02-18 DIAGNOSIS — M79675 Pain in left toe(s): Secondary | ICD-10-CM | POA: Diagnosis not present

## 2016-02-18 DIAGNOSIS — M79672 Pain in left foot: Secondary | ICD-10-CM | POA: Diagnosis not present

## 2016-02-18 DIAGNOSIS — M2042 Other hammer toe(s) (acquired), left foot: Secondary | ICD-10-CM | POA: Diagnosis not present

## 2016-03-07 DIAGNOSIS — J0100 Acute maxillary sinusitis, unspecified: Secondary | ICD-10-CM | POA: Diagnosis not present

## 2016-03-16 DIAGNOSIS — M25775 Osteophyte, left foot: Secondary | ICD-10-CM | POA: Diagnosis not present

## 2016-03-16 DIAGNOSIS — M2042 Other hammer toe(s) (acquired), left foot: Secondary | ICD-10-CM | POA: Diagnosis not present

## 2016-03-16 DIAGNOSIS — M79672 Pain in left foot: Secondary | ICD-10-CM | POA: Diagnosis not present

## 2016-03-17 DIAGNOSIS — Z7982 Long term (current) use of aspirin: Secondary | ICD-10-CM | POA: Diagnosis not present

## 2016-03-17 DIAGNOSIS — Z96652 Presence of left artificial knee joint: Secondary | ICD-10-CM | POA: Diagnosis not present

## 2016-03-17 DIAGNOSIS — M7752 Other enthesopathy of left foot: Secondary | ICD-10-CM | POA: Diagnosis not present

## 2016-03-17 DIAGNOSIS — J449 Chronic obstructive pulmonary disease, unspecified: Secondary | ICD-10-CM | POA: Diagnosis not present

## 2016-03-17 DIAGNOSIS — I10 Essential (primary) hypertension: Secondary | ICD-10-CM | POA: Diagnosis not present

## 2016-03-17 DIAGNOSIS — Z79899 Other long term (current) drug therapy: Secondary | ICD-10-CM | POA: Diagnosis not present

## 2016-03-17 DIAGNOSIS — Z833 Family history of diabetes mellitus: Secondary | ICD-10-CM | POA: Diagnosis not present

## 2016-03-17 DIAGNOSIS — Z01818 Encounter for other preprocedural examination: Secondary | ICD-10-CM | POA: Diagnosis not present

## 2016-03-17 DIAGNOSIS — M2042 Other hammer toe(s) (acquired), left foot: Secondary | ICD-10-CM | POA: Diagnosis not present

## 2016-03-17 DIAGNOSIS — Z8249 Family history of ischemic heart disease and other diseases of the circulatory system: Secondary | ICD-10-CM | POA: Diagnosis not present

## 2016-03-18 DIAGNOSIS — J449 Chronic obstructive pulmonary disease, unspecified: Secondary | ICD-10-CM | POA: Diagnosis not present

## 2016-03-18 DIAGNOSIS — M25775 Osteophyte, left foot: Secondary | ICD-10-CM | POA: Diagnosis not present

## 2016-03-18 DIAGNOSIS — Z79899 Other long term (current) drug therapy: Secondary | ICD-10-CM | POA: Diagnosis not present

## 2016-03-18 DIAGNOSIS — M2042 Other hammer toe(s) (acquired), left foot: Secondary | ICD-10-CM | POA: Diagnosis not present

## 2016-03-18 DIAGNOSIS — Z833 Family history of diabetes mellitus: Secondary | ICD-10-CM | POA: Diagnosis not present

## 2016-03-18 DIAGNOSIS — M7752 Other enthesopathy of left foot: Secondary | ICD-10-CM | POA: Diagnosis not present

## 2016-03-18 DIAGNOSIS — M79672 Pain in left foot: Secondary | ICD-10-CM | POA: Diagnosis not present

## 2016-03-18 DIAGNOSIS — Z8249 Family history of ischemic heart disease and other diseases of the circulatory system: Secondary | ICD-10-CM | POA: Diagnosis not present

## 2016-03-18 DIAGNOSIS — I10 Essential (primary) hypertension: Secondary | ICD-10-CM | POA: Diagnosis not present

## 2016-03-18 DIAGNOSIS — Z96652 Presence of left artificial knee joint: Secondary | ICD-10-CM | POA: Diagnosis not present

## 2016-03-18 DIAGNOSIS — Z7982 Long term (current) use of aspirin: Secondary | ICD-10-CM | POA: Diagnosis not present

## 2016-05-14 DIAGNOSIS — I10 Essential (primary) hypertension: Secondary | ICD-10-CM | POA: Diagnosis not present

## 2016-05-14 DIAGNOSIS — E7800 Pure hypercholesterolemia, unspecified: Secondary | ICD-10-CM | POA: Diagnosis not present

## 2016-05-21 DIAGNOSIS — N401 Enlarged prostate with lower urinary tract symptoms: Secondary | ICD-10-CM | POA: Diagnosis not present

## 2016-05-21 DIAGNOSIS — Z0001 Encounter for general adult medical examination with abnormal findings: Secondary | ICD-10-CM | POA: Diagnosis not present

## 2016-06-03 DIAGNOSIS — G5603 Carpal tunnel syndrome, bilateral upper limbs: Secondary | ICD-10-CM | POA: Diagnosis not present

## 2016-07-08 DIAGNOSIS — G5603 Carpal tunnel syndrome, bilateral upper limbs: Secondary | ICD-10-CM | POA: Diagnosis not present

## 2016-07-28 DIAGNOSIS — I10 Essential (primary) hypertension: Secondary | ICD-10-CM | POA: Diagnosis not present

## 2016-07-28 DIAGNOSIS — G5601 Carpal tunnel syndrome, right upper limb: Secondary | ICD-10-CM | POA: Diagnosis not present

## 2016-07-28 DIAGNOSIS — G5602 Carpal tunnel syndrome, left upper limb: Secondary | ICD-10-CM | POA: Diagnosis not present

## 2016-07-28 DIAGNOSIS — E785 Hyperlipidemia, unspecified: Secondary | ICD-10-CM | POA: Diagnosis not present

## 2016-07-28 DIAGNOSIS — M79643 Pain in unspecified hand: Secondary | ICD-10-CM | POA: Diagnosis not present

## 2016-08-20 DIAGNOSIS — G5602 Carpal tunnel syndrome, left upper limb: Secondary | ICD-10-CM | POA: Diagnosis not present

## 2016-08-20 DIAGNOSIS — G5601 Carpal tunnel syndrome, right upper limb: Secondary | ICD-10-CM | POA: Diagnosis not present

## 2016-09-22 DIAGNOSIS — G5603 Carpal tunnel syndrome, bilateral upper limbs: Secondary | ICD-10-CM | POA: Diagnosis not present

## 2016-10-01 DIAGNOSIS — Z87891 Personal history of nicotine dependence: Secondary | ICD-10-CM | POA: Diagnosis not present

## 2016-10-01 DIAGNOSIS — Z96653 Presence of artificial knee joint, bilateral: Secondary | ICD-10-CM | POA: Diagnosis not present

## 2016-10-01 DIAGNOSIS — J449 Chronic obstructive pulmonary disease, unspecified: Secondary | ICD-10-CM | POA: Diagnosis not present

## 2016-10-01 DIAGNOSIS — N400 Enlarged prostate without lower urinary tract symptoms: Secondary | ICD-10-CM | POA: Diagnosis not present

## 2016-10-01 DIAGNOSIS — Z8249 Family history of ischemic heart disease and other diseases of the circulatory system: Secondary | ICD-10-CM | POA: Diagnosis not present

## 2016-10-01 DIAGNOSIS — I10 Essential (primary) hypertension: Secondary | ICD-10-CM | POA: Diagnosis not present

## 2016-10-01 DIAGNOSIS — G5601 Carpal tunnel syndrome, right upper limb: Secondary | ICD-10-CM | POA: Diagnosis not present

## 2016-10-02 DIAGNOSIS — N400 Enlarged prostate without lower urinary tract symptoms: Secondary | ICD-10-CM | POA: Diagnosis not present

## 2016-10-02 DIAGNOSIS — G5601 Carpal tunnel syndrome, right upper limb: Secondary | ICD-10-CM | POA: Diagnosis not present

## 2016-10-02 DIAGNOSIS — Z96653 Presence of artificial knee joint, bilateral: Secondary | ICD-10-CM | POA: Diagnosis not present

## 2016-10-02 DIAGNOSIS — J449 Chronic obstructive pulmonary disease, unspecified: Secondary | ICD-10-CM | POA: Diagnosis not present

## 2016-10-02 DIAGNOSIS — Z87891 Personal history of nicotine dependence: Secondary | ICD-10-CM | POA: Diagnosis not present

## 2016-10-02 DIAGNOSIS — I10 Essential (primary) hypertension: Secondary | ICD-10-CM | POA: Diagnosis not present

## 2016-10-02 DIAGNOSIS — Z8249 Family history of ischemic heart disease and other diseases of the circulatory system: Secondary | ICD-10-CM | POA: Diagnosis not present

## 2016-11-19 DIAGNOSIS — G5603 Carpal tunnel syndrome, bilateral upper limbs: Secondary | ICD-10-CM | POA: Diagnosis not present

## 2016-11-27 DIAGNOSIS — J449 Chronic obstructive pulmonary disease, unspecified: Secondary | ICD-10-CM | POA: Diagnosis not present

## 2016-11-27 DIAGNOSIS — Z96653 Presence of artificial knee joint, bilateral: Secondary | ICD-10-CM | POA: Diagnosis not present

## 2016-11-27 DIAGNOSIS — G5602 Carpal tunnel syndrome, left upper limb: Secondary | ICD-10-CM | POA: Diagnosis not present

## 2016-11-27 DIAGNOSIS — Z8249 Family history of ischemic heart disease and other diseases of the circulatory system: Secondary | ICD-10-CM | POA: Diagnosis not present

## 2016-11-27 DIAGNOSIS — Z87891 Personal history of nicotine dependence: Secondary | ICD-10-CM | POA: Diagnosis not present

## 2016-11-27 DIAGNOSIS — I10 Essential (primary) hypertension: Secondary | ICD-10-CM | POA: Diagnosis not present

## 2016-11-27 DIAGNOSIS — N400 Enlarged prostate without lower urinary tract symptoms: Secondary | ICD-10-CM | POA: Diagnosis not present

## 2016-11-27 DIAGNOSIS — M1990 Unspecified osteoarthritis, unspecified site: Secondary | ICD-10-CM | POA: Diagnosis not present

## 2016-12-29 DIAGNOSIS — M545 Low back pain: Secondary | ICD-10-CM | POA: Diagnosis not present

## 2016-12-29 DIAGNOSIS — Z79899 Other long term (current) drug therapy: Secondary | ICD-10-CM | POA: Diagnosis not present

## 2016-12-29 DIAGNOSIS — S51812A Laceration without foreign body of left forearm, initial encounter: Secondary | ICD-10-CM | POA: Diagnosis not present

## 2016-12-29 DIAGNOSIS — S0001XA Abrasion of scalp, initial encounter: Secondary | ICD-10-CM | POA: Diagnosis not present

## 2016-12-29 DIAGNOSIS — S5012XA Contusion of left forearm, initial encounter: Secondary | ICD-10-CM | POA: Diagnosis not present

## 2016-12-29 DIAGNOSIS — J449 Chronic obstructive pulmonary disease, unspecified: Secondary | ICD-10-CM | POA: Diagnosis not present

## 2016-12-29 DIAGNOSIS — Z7951 Long term (current) use of inhaled steroids: Secondary | ICD-10-CM | POA: Diagnosis not present

## 2016-12-29 DIAGNOSIS — Z87891 Personal history of nicotine dependence: Secondary | ICD-10-CM | POA: Diagnosis not present

## 2016-12-29 DIAGNOSIS — W228XXA Striking against or struck by other objects, initial encounter: Secondary | ICD-10-CM | POA: Diagnosis not present

## 2016-12-29 DIAGNOSIS — S3992XA Unspecified injury of lower back, initial encounter: Secondary | ICD-10-CM | POA: Diagnosis not present

## 2016-12-29 DIAGNOSIS — M1990 Unspecified osteoarthritis, unspecified site: Secondary | ICD-10-CM | POA: Diagnosis not present

## 2016-12-29 DIAGNOSIS — Z23 Encounter for immunization: Secondary | ICD-10-CM | POA: Diagnosis not present

## 2016-12-29 DIAGNOSIS — I10 Essential (primary) hypertension: Secondary | ICD-10-CM | POA: Diagnosis not present

## 2016-12-29 DIAGNOSIS — S59912A Unspecified injury of left forearm, initial encounter: Secondary | ICD-10-CM | POA: Diagnosis not present

## 2016-12-29 DIAGNOSIS — M7989 Other specified soft tissue disorders: Secondary | ICD-10-CM | POA: Diagnosis not present

## 2016-12-29 DIAGNOSIS — Z7982 Long term (current) use of aspirin: Secondary | ICD-10-CM | POA: Diagnosis not present

## 2017-01-06 DIAGNOSIS — L0291 Cutaneous abscess, unspecified: Secondary | ICD-10-CM | POA: Diagnosis not present

## 2017-01-25 DIAGNOSIS — I10 Essential (primary) hypertension: Secondary | ICD-10-CM | POA: Diagnosis not present

## 2017-01-25 DIAGNOSIS — E7800 Pure hypercholesterolemia, unspecified: Secondary | ICD-10-CM | POA: Diagnosis not present

## 2017-01-25 DIAGNOSIS — L219 Seborrheic dermatitis, unspecified: Secondary | ICD-10-CM | POA: Diagnosis not present

## 2017-01-25 DIAGNOSIS — J302 Other seasonal allergic rhinitis: Secondary | ICD-10-CM | POA: Diagnosis not present

## 2017-01-25 DIAGNOSIS — N401 Enlarged prostate with lower urinary tract symptoms: Secondary | ICD-10-CM | POA: Diagnosis not present

## 2017-02-17 DIAGNOSIS — I10 Essential (primary) hypertension: Secondary | ICD-10-CM | POA: Diagnosis not present

## 2017-05-24 DIAGNOSIS — I10 Essential (primary) hypertension: Secondary | ICD-10-CM | POA: Diagnosis not present

## 2017-05-24 DIAGNOSIS — E7800 Pure hypercholesterolemia, unspecified: Secondary | ICD-10-CM | POA: Diagnosis not present

## 2017-05-24 DIAGNOSIS — J440 Chronic obstructive pulmonary disease with acute lower respiratory infection: Secondary | ICD-10-CM | POA: Diagnosis not present

## 2017-05-26 DIAGNOSIS — I10 Essential (primary) hypertension: Secondary | ICD-10-CM | POA: Diagnosis not present

## 2017-05-26 DIAGNOSIS — Z0001 Encounter for general adult medical examination with abnormal findings: Secondary | ICD-10-CM | POA: Diagnosis not present

## 2017-06-17 DIAGNOSIS — G5603 Carpal tunnel syndrome, bilateral upper limbs: Secondary | ICD-10-CM | POA: Diagnosis not present

## 2017-06-25 DIAGNOSIS — I10 Essential (primary) hypertension: Secondary | ICD-10-CM | POA: Diagnosis not present

## 2017-06-25 DIAGNOSIS — D729 Disorder of white blood cells, unspecified: Secondary | ICD-10-CM | POA: Diagnosis not present

## 2017-06-25 DIAGNOSIS — J440 Chronic obstructive pulmonary disease with acute lower respiratory infection: Secondary | ICD-10-CM | POA: Diagnosis not present

## 2017-06-25 DIAGNOSIS — E7800 Pure hypercholesterolemia, unspecified: Secondary | ICD-10-CM | POA: Diagnosis not present

## 2017-06-28 DIAGNOSIS — Z6824 Body mass index (BMI) 24.0-24.9, adult: Secondary | ICD-10-CM | POA: Diagnosis not present

## 2017-06-28 DIAGNOSIS — N401 Enlarged prostate with lower urinary tract symptoms: Secondary | ICD-10-CM | POA: Diagnosis not present

## 2017-06-28 DIAGNOSIS — J440 Chronic obstructive pulmonary disease with acute lower respiratory infection: Secondary | ICD-10-CM | POA: Diagnosis not present

## 2017-06-28 DIAGNOSIS — D729 Disorder of white blood cells, unspecified: Secondary | ICD-10-CM | POA: Diagnosis not present

## 2017-07-05 DIAGNOSIS — M25579 Pain in unspecified ankle and joints of unspecified foot: Secondary | ICD-10-CM | POA: Diagnosis not present

## 2017-07-05 DIAGNOSIS — M79672 Pain in left foot: Secondary | ICD-10-CM | POA: Diagnosis not present

## 2017-07-05 DIAGNOSIS — M25775 Osteophyte, left foot: Secondary | ICD-10-CM | POA: Diagnosis not present

## 2017-08-24 DIAGNOSIS — D729 Disorder of white blood cells, unspecified: Secondary | ICD-10-CM | POA: Diagnosis not present

## 2017-09-03 DIAGNOSIS — R197 Diarrhea, unspecified: Secondary | ICD-10-CM | POA: Diagnosis not present

## 2017-09-03 DIAGNOSIS — Z6823 Body mass index (BMI) 23.0-23.9, adult: Secondary | ICD-10-CM | POA: Diagnosis not present

## 2017-09-03 DIAGNOSIS — G4730 Sleep apnea, unspecified: Secondary | ICD-10-CM | POA: Diagnosis not present

## 2017-09-09 DIAGNOSIS — J438 Other emphysema: Secondary | ICD-10-CM | POA: Diagnosis not present

## 2017-09-09 DIAGNOSIS — R197 Diarrhea, unspecified: Secondary | ICD-10-CM | POA: Diagnosis not present

## 2017-09-09 DIAGNOSIS — I700 Atherosclerosis of aorta: Secondary | ICD-10-CM | POA: Diagnosis not present

## 2017-09-14 ENCOUNTER — Encounter: Payer: Self-pay | Admitting: Neurology

## 2017-09-15 ENCOUNTER — Ambulatory Visit: Payer: Medicare Other | Admitting: Neurology

## 2017-09-15 ENCOUNTER — Encounter: Payer: Self-pay | Admitting: Neurology

## 2017-09-15 VITALS — BP 129/74 | HR 80 | Ht 71.0 in | Wt 162.0 lb

## 2017-09-15 DIAGNOSIS — G4733 Obstructive sleep apnea (adult) (pediatric): Secondary | ICD-10-CM | POA: Diagnosis not present

## 2017-09-15 DIAGNOSIS — R0683 Snoring: Secondary | ICD-10-CM | POA: Insufficient documentation

## 2017-09-15 DIAGNOSIS — F518 Other sleep disorders not due to a substance or known physiological condition: Secondary | ICD-10-CM

## 2017-09-15 DIAGNOSIS — J449 Chronic obstructive pulmonary disease, unspecified: Secondary | ICD-10-CM | POA: Diagnosis not present

## 2017-09-15 DIAGNOSIS — R64 Cachexia: Secondary | ICD-10-CM | POA: Insufficient documentation

## 2017-09-15 DIAGNOSIS — Z9989 Dependence on other enabling machines and devices: Secondary | ICD-10-CM

## 2017-09-15 NOTE — Progress Notes (Addendum)
SLEEP MEDICINE CLINIC   Provider:  Melvyn Novas, M D  Primary Care Physician:  Selinda Flavin, MD   Referring Provider: Selinda Flavin, MD    Chief Complaint  Patient presents with  . New Patient (Initial Visit)    pt alone, rm 10. pt states that he snores in sleep. some nights he sleeps good and other nights he doesnt sleep as well. states that he feels well rested although he typically takes a nap after lunch. (pt hoping to have HST completed vs in lab)    HPI:  Mark Solomon  " Mark Solomon " is a 77 y.o. male patient and  seen here on 09-15-2017  in a referral via Dr. Dimas Aguas for an evaluation of sleep apnea/.   Mark Solomon is a 77 year old Caucasian right-handed gentleman, seen here in the presence of his wife.    He presented to Dr. Dimas Aguas  with symptoms of sleep apnea and upper airway obstruction which has been described as chronically present.  His visit with Dr. Dimas Aguas took place on 03 September 2017 at 2 PM, at the time he had a regular respiratory rate of 16, heart rate of 76 regular, blood pressure 108/62, height 5 foot 8 inches and weight at the time 156 pounds.  He also is snoring loudly according to his wife, who is a CPAP user, sleeping with his mouth dropped open. Marland Kitchen  He received an overnight oximetry watch pat which documented repeated short hypoxemia periods.He then based on this pattern estimated AHI of 30.6, total sleep time was estimated to be 6 hours and 5 minutes, with only 6.6 minutes of total desaturation time below 89% SPO2.  He did have bradycardia at night his lowest heart rate was 36, maximum heart rate 87.  This also supports the suspicion of obstructive sleep apnea to be present.  He has been evaluated for unwanted Weight loss over a 6-8 monhth, following carpal tunnel, 2 knee surgeries, 2 shoulder operations.  He stated he lost his appetite, a chest CT ordered by Dr. Dimas Aguas was negative for Tumor, he has COPD, has dysphonia.  Chief complaint according to  patient :" he has no compliant about his sleep " he doesn't nap in daytime.   Sleep habits are as follows: dinner is at 7 Pm, and the patient watches TV after that, until 9.30 PM when he goes to bed. He will be asleep there until 11 PM-  And returns to the den-  There he stays asleep all night - reports not waking up at all, sleeping in he recliner in the den  - not in bed.  Helps his breathing and shoulder pain. While the Bedroom is cool, quiet and dark. He feels it is too cold. He returns to the den and finally sleeps in [employer]- with the TV on. He has one nocturia at 3 AM - and still repeats he  sleeps through the night. His wife reports him waking at 5 AM.  Averages only 5-6 hours of sleep. Naps in daytime- if he sits in a recliner - his wife finds him asleep when returning form work at 5.30 PM.     Sleep medical history and family sleep history:    Social history: married Mark Solomon- lost one daughter at age 66 to spina bifida.Former smoker, still drinks beers, sometimes liquor and sometimes wine. 2 a day.  Caffeine - coffee in AM- no other caffeine sources.   Review of Systems: Out of a complete 14  system review, the patient complains of only the following symptoms, and all other reviewed systems are negative. hearing loss, dysphonia, joint pain, tinnitus.   Epworth Sleepiness score 16/ 24    , Fatigue severity score 48/ 63  , depression score 4/ 15 points.    Social History   Socioeconomic History  . Marital status: Married    Spouse name: Not on file  . Number of children: Not on file  . Years of education: Not on file  . Highest education level: Not on file  Occupational History  . Not on file  Social Needs  . Financial resource strain: Not on file  . Food insecurity:    Worry: Not on file    Inability: Not on file  . Transportation needs:    Medical: Not on file    Non-medical: Not on file  Tobacco Use  . Smoking status: Former Smoker    Last attempt to quit: 01/13/2011     Years since quitting: 6.6  . Smokeless tobacco: Never Used  Substance and Sexual Activity  . Alcohol use: Yes    Alcohol/week: 14.0 standard drinks    Types: 14 Cans of beer per week  . Drug use: No  . Sexual activity: Not on file  Lifestyle  . Physical activity:    Days per week: Not on file    Minutes per session: Not on file  . Stress: Not on file  Relationships  . Social connections:    Talks on phone: Not on file    Gets together: Not on file    Attends religious service: Not on file    Active member of club or organization: Not on file    Attends meetings of clubs or organizations: Not on file    Relationship status: Not on file  . Intimate partner violence:    Fear of current or ex partner: Not on file    Emotionally abused: Not on file    Physically abused: Not on file    Forced sexual activity: Not on file  Other Topics Concern  . Not on file  Social History Narrative  . Not on file    Family History  Problem Relation Age of Onset  . Diabetes Mother   . Heart attack Mother   . Heart attack Father     Past Medical History:  Diagnosis Date  . Aortic valve sclerosis   . Arthritis   . COPD (chronic obstructive pulmonary disease) (HCC)   . Diastolic dysfunction   . Hyperlipidemia   . Hypertension   . Kidney stone     Past Surgical History:  Procedure Laterality Date  . COLONOSCOPY W/ BIOPSIES AND POLYPECTOMY    . HAMMER TOE SURGERY    . JOINT REPLACEMENT    . KNEE ARTHROSCOPY W/ MENISCAL REPAIR     right   . MULTIPLE TOOTH EXTRACTIONS    . NM MYOCAR PERF WALL MOTION  11/14/2008   Normal  . REPLACEMENT TOTAL KNEE  1994   left  . ROTATOR CUFF REPAIR Bilateral approx 20 years   [place], [place]   . TOTAL KNEE ARTHROPLASTY Right 02/08/2015   Procedure: TOTAL KNEE ARTHROPLASTY;  Surgeon: Frederico Hamman, MD;  Location: [facility];  Service: Orthopedics;  Laterality: Right;  . US ECHOCARDIOGRAPHY  01/23/10   AOV appears sclerotic, borderline LA enlargement     Current Outpatient Medications  Medication Sig Dispense Refill  . aspirin EC 81 MG tablet Take 81 mg by mouth daily.    Marland Kitchen  beclomethasone (QVAR) 40 MCG/ACT inhaler Inhale 1 puff into the lungs daily as needed.    . Coenzyme Q10 (COQ10 PO) Take 1 capsule by mouth daily.    Marland Kitchen ezetimibe-simvastatin (VYTORIN) 10-40 MG per tablet Take 1 tablet by mouth daily.    . fluticasone (FLONASE) 50 MCG/ACT nasal spray Place 2 sprays into both nostrils every morning.    Marland Kitchen ketoconazole (NIZORAL) 2 % shampoo Apply 1 application topically daily.    Marland Kitchen losartan (COZAAR) 50 MG tablet Take 50 mg by mouth daily.    . Multiple Vitamins-Minerals (MULTIVITAMIN WITH MINERALS) tablet Take 1 tablet by mouth daily.    . simvastatin (ZOCOR) 40 MG tablet Take by mouth.    . tamsulosin (FLOMAX) 0.4 MG CAPS capsule Take 0.4 mg by mouth daily.    Marland Kitchen apixaban (ELIQUIS) 2.5 MG TABS tablet Take 1 tablet (2.5 mg total) by mouth 2 (two) times daily. (Patient not taking: Reported on 09/15/2017) 30 tablet 0   No current facility-administered medications for this visit.     Allergies as of 09/15/2017  . (No Known Allergies)    Vitals: BP 129/74   Pulse 80   Ht 5\' 11"  (1.803 m)   Wt 162 lb (73.5 kg)   BMI 22.59 kg/m  Last Weight:  Wt Readings from Last 1 Encounters:  09/15/17 162 lb (73.5 kg)   ELT:RVUY mass index is 22.59 kg/m.     Last Height:   Ht Readings from Last 1 Encounters:  09/15/17 5\' 11"  (1.803 m)    Physical exam:  General: The patient is awake, alert and appears not in acute distress. The patient is defensive and hard of hearing- not happy to be here.   Head: Normocephalic, atraumatic. Neck is supple. Mallampati 2  ,  neck circumference:15. 5. Nasal airflow congested ,  Retrognathia is seen.  Cardiovascular:  Regular rate and rhythm, without  murmurs or carotid bruit, and without distended neck veins. Respiratory: Lungs are clear to auscultation. Skin:  Without evidence of edema, or rash Trunk: BMI  is 22.5 . The patient's posture is stooped.   Neurologic exam : The patient is awake and alert, oriented to place and time.   Speech is raspy,  Dysphonia.  Mood and affect -   Cranial nerves: Pupils are equal and briskly reactive to light. Funduscopic exam - status pots cataract -without evidence of pallor or edema. Extraocular movements  in vertical and horizontal planes intact and without nystagmus. Visual fields by finger perimetry are intact. Hearing to finger rub impaired/  Facial sensation intact to fine touch.  Facial motor strength is symmetric and tongue and uvula move midline.   Motor exam:   Normal tone, muscle bulk and symmetric strength in all extremities. 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  Sensory:  Fine touch, pinprick and vibration were impaired, he has lost vibratory sense at I either ankle, and sensation was reduced at the knee  Coordination: Rapid alternating movements in the fingers/hands was normal. Finger-to-nose maneuver  normal without evidence of ataxia, dysmetria or tremor.  Gait and station: Patient walks without assistive device Deep tendon reflexes: in the  upper and lower extremities are symmetric and intact.   Assessment:  After physical and neurologic examination, review of laboratory studies,  Personal review of imaging studies, reports of other /same  Imaging studies, results of polysomnography and / or neurophysiology testing and pre-existing records as far as provided in visit., my assessment is  1)  OSA-  Informal watch pat test Estimated AHI over 30.6/h- with REM sleep being present.  He had to sleep in a recliner is opposed to the lab environment. Has COPD - former smoker.  2)  excessive daytime sleepiness. Fatigue and daytime napping, Epworth 16 points.  3)  Hearing loss , tinnitus-  [employer] veteran, [place].  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    The patient was advised of the nature of the diagnosed disorder , the treatment options and the  risks for general health and wellness arising from not treating the condition.   I spent more than 50  minutes of face to face time with the patient.  Greater than 50% of time was spent in counseling and coordination of care. We have discussed the diagnosis and differential and I answered the patient's questions.    Plan:  Treatment plan and additional workup :  The patient uses daily inhalers to help him breathe with the underlying condition of COPD.  Begin formal home sleep test confirmed the presence of apnea but to my surprise did not show prolonged hypoxemia.  I would expect a patient with COPD to have low oxygen levels especially during REM sleep.  I will therefore order now unofficial home sleep test, a home sleep test cannot be used to titrate oxygen even if needed, it cannot confirm the presence of periodic limb movements or restless legs, it does not reliably estimate the amount of REM sleep versus non-REM sleep. Given the underlying comorbidities and the recent unwanted weight loss, cachexia, he is in need of a fast evaluation- and we will provide CPAP therapy, I will not be able to order home oxygen if needed.   Melvyn Novas, MD 09/15/2017, 3:02 PM  Certified in Neurology by ABPN Certified in Sleep Medicine by [facility] [address], [address] [place], [place] [address]

## 2017-09-15 NOTE — Patient Instructions (Signed)

## 2017-09-17 ENCOUNTER — Ambulatory Visit (INDEPENDENT_AMBULATORY_CARE_PROVIDER_SITE_OTHER): Payer: Medicare Other | Admitting: Neurology

## 2017-09-17 DIAGNOSIS — G4733 Obstructive sleep apnea (adult) (pediatric): Secondary | ICD-10-CM

## 2017-09-17 DIAGNOSIS — J449 Chronic obstructive pulmonary disease, unspecified: Secondary | ICD-10-CM

## 2017-09-17 DIAGNOSIS — R0683 Snoring: Secondary | ICD-10-CM

## 2017-09-17 DIAGNOSIS — F518 Other sleep disorders not due to a substance or known physiological condition: Secondary | ICD-10-CM

## 2017-09-17 DIAGNOSIS — R64 Cachexia: Secondary | ICD-10-CM

## 2017-09-17 DIAGNOSIS — G4731 Primary central sleep apnea: Secondary | ICD-10-CM

## 2017-09-24 NOTE — Addendum Note (Signed)
Addended by: Melvyn NovasHMEIER, Glenys Snader on: 09/24/2017 02:07 PM   Modules accepted: Orders

## 2017-09-24 NOTE — Procedures (Signed)
NAME:  Weber CooksWilliam D. Edgley                                                                DOB: 06-Sep-1940 MEDICAL RECORD [id number]                                                      DOS: 09/20/2017  REFERRING PHYSICIAN: Selinda FlavinKevin Howard, MD STUDY PERFORMED: Home Sleep Study  HISTORY: Rada HayWilliam D Alter " Onalee HuaDavid " is a 77 y.o. male patient and seen here on 09-15-2017  in a referral via Dr. Dimas AguasHoward for an evaluation of sleep apnea.  Mr. Myles RosenthalWilliam David Almanzar is a 77 year old Caucasian right-handed gentleman, seen here in the presence of his wife.  He presented with symptoms of sleep apnea and upper airway obstruction which has been chronically present.  He has dysphonia, he is snoring loudly according to his wife, who is a CPAP user, and is sleeping with his mouth dropped open.  He underwent an overnight oximetry which documented repeated short hypoxemia periods. Based on this test had estimated AHI of 30.6, total sleep time was estimated to be 6 hours and 5 minutes, with only 6.6 minutes of total desaturation time below 89% SPO2.  He did have bradycardia, lowest heart rate was 36, maximum heart rate 87 bpm. This also supports the suspicion of obstructive sleep apnea to be present. He has been evaluated for unwanted weight loss over a period of 6-8 month, following several surgeries.  He stated he "lost appetite", BMI: 22.9. Epworth Sleepiness Score at 16/24.  STUDY RESULTS:  Total Recording Time: 8 hours 40 minutes, valid test time 5 hrs 40 min.  Total Apnea/Hypopnea Index (AHI): 21.7 /h; RDI: 21.9/h. Average Oxygen Saturation:   96 %; Lowest Oxygen Saturation: 65 % REM sleep accentuated.  Total Time Oxygen in Saturation below 89 %: 8.4 minutes  Average Heart Rate:  67 bpm - 45 and 86 bpm.  IMPRESSION: Moderate Complex Sleep Apnea, with 17.8% Central Apneas while sleeping in a recliner. RECOMMENDATION: I would strongly recommend CPAP therapy. I will place an order for auto CPAP device with a pressure window 5-15  cm water, 3 cm EPR and interface of patient's choice / comfort.   I certify that I have reviewed the raw data recording prior to the issuance of this report in accordance with the standards of the American Academy of Sleep Medicine (AASM). Melvyn Novasarmen Mearle Drew, M.D.   09-24-2017    Medical Director of [facility], accredited by the AASM. Diplomat of the ABPN and ABSM.

## 2017-09-27 ENCOUNTER — Telehealth: Payer: Self-pay | Admitting: Neurology

## 2017-09-27 NOTE — Telephone Encounter (Signed)
I called pt and spoke with his wife. I advised pt that Dr. Vickey Hugerohmeier reviewed their sleep study results and found that pt has sleep apnea. Dr. Vickey Hugerohmeier recommends that pt starts a auto CPAP machine to help with treating this. I reviewed PAP compliance expectations with the pt. Pt is agreeable to starting a CPAP. I advised pt that an order will be sent to a DME, [facility], [facility] about one week after they file with the pt's insurance. [facility] will show the pt how to use the machine, fit for masks, and troubleshoot the CPAP if needed. A follow up appt was made for insurance purposes with Butch PennyMegan Millikan, NP on  Dec 12,2019 at 10:00 am. Pt verbalized understanding to arrive 15 minutes early and bring their CPAP. A letter with all of this information in it will be mailed to the pt as a reminder. I verified with the pt that the address we have on file is correct. Pt verbalized understanding of results. Pt had no questions at this time but was encouraged to call back if questions arise.

## 2017-09-27 NOTE — Telephone Encounter (Signed)
-----   Message from Melvyn Novasarmen Dohmeier, MD sent at 09/24/2017  2:07 PM EDT ----- Average Heart Rate: 67 bpm - 45 and 86 bpm.  IMPRESSION: Moderate Complex Sleep Apnea, with 17.8% Central  Apneas while sleeping in a recliner. RECOMMENDATION: Mr. Marlinda MikeDallas is sleepy and highly fatigued, sleeps in a recliner- and has complex apnea.  I would strongly recommend CPAP therapy. In lab titration is not going to work.   I will place an order for auto CPAP device with a pressure window 5-15  cm water, 3 cm EPR and interface of patient's choice / comfort.   Cc Selinda FlavinKevin Howard, MD

## 2017-10-05 DIAGNOSIS — G4733 Obstructive sleep apnea (adult) (pediatric): Secondary | ICD-10-CM | POA: Diagnosis not present

## 2017-10-05 DIAGNOSIS — Z6824 Body mass index (BMI) 24.0-24.9, adult: Secondary | ICD-10-CM | POA: Diagnosis not present

## 2017-10-05 DIAGNOSIS — G4730 Sleep apnea, unspecified: Secondary | ICD-10-CM | POA: Diagnosis not present

## 2017-10-09 DIAGNOSIS — Z23 Encounter for immunization: Secondary | ICD-10-CM | POA: Diagnosis not present

## 2017-11-04 DIAGNOSIS — G4733 Obstructive sleep apnea (adult) (pediatric): Secondary | ICD-10-CM | POA: Diagnosis not present

## 2017-12-05 DIAGNOSIS — G4733 Obstructive sleep apnea (adult) (pediatric): Secondary | ICD-10-CM | POA: Diagnosis not present

## 2017-12-13 DIAGNOSIS — I10 Essential (primary) hypertension: Secondary | ICD-10-CM | POA: Diagnosis not present

## 2017-12-20 DIAGNOSIS — T18120A Food in esophagus causing compression of trachea, initial encounter: Secondary | ICD-10-CM | POA: Diagnosis not present

## 2017-12-20 DIAGNOSIS — T18108A Unspecified foreign body in esophagus causing other injury, initial encounter: Secondary | ICD-10-CM | POA: Diagnosis not present

## 2017-12-20 DIAGNOSIS — Z87891 Personal history of nicotine dependence: Secondary | ICD-10-CM | POA: Diagnosis not present

## 2017-12-20 DIAGNOSIS — Z7982 Long term (current) use of aspirin: Secondary | ICD-10-CM | POA: Diagnosis not present

## 2017-12-20 DIAGNOSIS — J449 Chronic obstructive pulmonary disease, unspecified: Secondary | ICD-10-CM | POA: Diagnosis not present

## 2017-12-20 DIAGNOSIS — I10 Essential (primary) hypertension: Secondary | ICD-10-CM | POA: Diagnosis not present

## 2017-12-20 DIAGNOSIS — Z79899 Other long term (current) drug therapy: Secondary | ICD-10-CM | POA: Diagnosis not present

## 2017-12-20 DIAGNOSIS — T18128A Food in esophagus causing other injury, initial encounter: Secondary | ICD-10-CM | POA: Diagnosis not present

## 2017-12-20 DIAGNOSIS — E7800 Pure hypercholesterolemia, unspecified: Secondary | ICD-10-CM | POA: Diagnosis not present

## 2017-12-21 ENCOUNTER — Encounter: Payer: Self-pay | Admitting: Neurology

## 2017-12-23 ENCOUNTER — Ambulatory Visit: Payer: Medicare Other | Admitting: Neurology

## 2017-12-23 ENCOUNTER — Encounter: Payer: Self-pay | Admitting: Neurology

## 2017-12-23 ENCOUNTER — Ambulatory Visit: Payer: Self-pay | Admitting: Adult Health

## 2017-12-23 VITALS — BP 136/74 | HR 79 | Ht 69.0 in | Wt 168.0 lb

## 2017-12-23 DIAGNOSIS — R64 Cachexia: Secondary | ICD-10-CM | POA: Diagnosis not present

## 2017-12-23 DIAGNOSIS — J449 Chronic obstructive pulmonary disease, unspecified: Secondary | ICD-10-CM

## 2017-12-23 DIAGNOSIS — G4733 Obstructive sleep apnea (adult) (pediatric): Secondary | ICD-10-CM | POA: Diagnosis not present

## 2017-12-23 NOTE — Progress Notes (Signed)
SLEEP MEDICINE CLINIC   Provider:  Melvyn Novas, MD   Primary Care Physician:  Selinda Flavin, MD   Referring Provider: Selinda Flavin, MD    Chief Complaint  Patient presents with  . Follow-up    pt here for initial cpap follow up. with wife, rm 10. pt states that took a little while to get used to the machine but for most part he says working well. DME [facility] pharmacy. the wife states that some nights there is a lot of air noise coming from the machine and the pt has had difficulties with mask.     HPI:  Mark Solomon  " Mark Solomon " is a 77 y.o. male patient and seen in a RV on 12-23-2017, in the presence of his wife.  Mark Solomon underwent a home sleep test on 20 September 2017 and was diagnosed with moderate severe sleep apnea at an AHI of 21.7/h with only slightly elevated RDI.  His oxygen nadir was 65% SPO2 and was accentuated during REM sleep.  The average heart rate was between 45 and 86 bpm his apnea was mostly obstructive in nature but there were about 20% central apneas.  He slept in a recliner during the home sleep test.  Based on this I ordered an auto titration between 5 and 15 cmH2O 3 cm EPR and a mask of the patient's comfort and fit.  He has been using this device at home 26 out of the last 30 days, and 423 of those days over 4 hours consecutively the average user time is 5 hours 26 minutes his AHI is 6.0/h.  There are still 1.3 unknown events and this residual AHI.  The residual seem to be mostly obstructive apneas.  The 95th percentile pressure was 13.4 but he does have significant air leaks.  He had about 1% of his user time spent with Cheyne-Stokes respirations, so-called central apneas. He has a sore mouth , dry and parched in the morning- and oral air leaks. He will meet with [facility], [facility] today to see if we find a better fit for him, and I will increase the auto CPAP pressure.     The patient was seen here on 09-15-2017  in a referral via Dr. Dimas Aguas for an  evaluation of sleep apnea. Mark Solomon is a 77 year old Caucasian right-handed gentleman, seen here in the presence of his wife.  He presented to Dr. Dimas Aguas  with symptoms of sleep apnea and upper airway obstruction which has been described as chronically present.  His visit with Dr. Dimas Aguas took place on 03 September 2017 at 2 PM, at the time he had a regular respiratory rate of 16, heart rate of 76 regular, blood pressure 108/62, height 5 foot 8 inches and weight at the time 156 pounds.  He also is snoring loudly according to his wife, who is a CPAP user, sleeping with his mouth dropped open. Marland Kitchen  He received an overnight oximetry watch pat which documented repeated short hypoxemia periods.He then based on this pattern estimated AHI of 30.6, total sleep time was estimated to be 6 hours and 5 minutes, with only 6.6 minutes of total desaturation time below 89% SPO2.  He did have bradycardia at night his lowest heart rate was 36, maximum heart rate 87.  This also supports the suspicion of obstructive sleep apnea to be present.  He has been evaluated for unwanted Weight loss over a 6-8 monhth, following carpal tunnel, 2 knee surgeries, 2  shoulder operations.  He stated he lost his appetite, a chest CT ordered by Dr. Dimas Aguas was negative for Tumor, he has COPD, has dysphonia.  Chief complaint according to patient :" he has no compliant about his sleep " he doesn't nap in daytime.   Sleep habits are as follows: dinner is at 7 PM, and the patient watches TV after that, until 9.30 PM when he goes to bed. He will be asleep there until 11 PM-  And returns to the den-  There he stays asleep all night - reports not waking up at all, sleeping in he recliner in the den  - not in bed.  Helps his breathing and shoulder pain. While the Bedroom is cool, quiet and dark. He feels it is too cold. He returns to the den and finally sleeps in [employer]- with the TV on. He has one nocturia at 3 AM - and still repeats he  sleeps  through the night. His wife reports him waking at 5 AM.  Averages only 5-6 hours of sleep. Naps in daytime- if he sits in a recliner - his wife finds him asleep when returning form work at 5.30 PM.     Sleep medical history and family sleep history:    Social history: married Mark Solomon- lost one daughter at age 32 to spina bifida.Former smoker, still drinks beers, sometimes liquor and sometimes wine. 2 a day. Caffeine - coffee in AM- no other caffeine sources.   Review of Systems: Out of a complete 14 system review, the patient complains of only the following symptoms, and all other reviewed systems are negative. hearing loss, dysphonia, joint pain, tinnitus.  Post CPAP Epworth : 13/ 24 and FSS at 36 / 63 -  His pres CPAP was : Epworth Sleepiness score 16/ 24  , Fatigue severity score 48/ 63  , depression score 4/ 15 points.    Social History   Socioeconomic History  . Marital status: Married    Spouse name: Not on file  . Number of children: Not on file  . Years of education: Not on file  . Highest education level: Not on file  Occupational History  . Not on file  Social Needs  . Financial resource strain: Not on file  . Food insecurity:    Worry: Not on file    Inability: Not on file  . Transportation needs:    Medical: Not on file    Non-medical: Not on file  Tobacco Use  . Smoking status: Former Smoker    Last attempt to quit: 01/13/2011    Years since quitting: 6.9  . Smokeless tobacco: Never Used  Substance and Sexual Activity  . Alcohol use: Yes    Alcohol/week: 14.0 standard drinks    Types: 14 Cans of beer per week  . Drug use: No  . Sexual activity: Not on file  Lifestyle  . Physical activity:    Days per week: Not on file    Minutes per session: Not on file  . Stress: Not on file  Relationships  . Social connections:    Talks on phone: Not on file    Gets together: Not on file    Attends religious service: Not on file    Active member of club or  organization: Not on file    Attends meetings of clubs or organizations: Not on file    Relationship status: Not on file  . Intimate partner violence:    Fear of current or  ex partner: Not on file    Emotionally abused: Not on file    Physically abused: Not on file    Forced sexual activity: Not on file  Other Topics Concern  . Not on file  Social History Narrative  . Not on file    Family History  Problem Relation Age of Onset  . Diabetes Mother   . Heart attack Mother   . Heart attack Father     Past Medical History:  Diagnosis Date  . Aortic valve sclerosis   . Arthritis   . COPD (chronic obstructive pulmonary disease) (HCC)   . Diastolic dysfunction   . Hyperlipidemia   . Hypertension   . Kidney stone     Past Surgical History:  Procedure Laterality Date  . COLONOSCOPY W/ BIOPSIES AND POLYPECTOMY    . HAMMER TOE SURGERY    . JOINT REPLACEMENT    . KNEE ARTHROSCOPY W/ MENISCAL REPAIR     right   . MULTIPLE TOOTH EXTRACTIONS    . NM MYOCAR PERF WALL MOTION  11/14/2008   Normal  . REPLACEMENT TOTAL KNEE  1994   left  . ROTATOR CUFF REPAIR Bilateral approx 20 years   [place], [place]   . TOTAL KNEE ARTHROPLASTY Right 02/08/2015   Procedure: TOTAL KNEE ARTHROPLASTY;  Surgeon: Frederico Hamman, MD;  Location: [facility] OR;  Service: Orthopedics;  Laterality: Right;  . US ECHOCARDIOGRAPHY  01/23/10   AOV appears sclerotic, borderline LA enlargement    Current Outpatient Medications  Medication Sig Dispense Refill  . aspirin EC 81 MG tablet Take 81 mg by mouth daily.    . beclomethasone (QVAR) 40 MCG/ACT inhaler Inhale 1 puff into the lungs daily as needed.    . fluticasone (FLONASE) 50 MCG/ACT nasal spray Place 2 sprays into both nostrils every morning.    Marland Kitchen ketoconazole (NIZORAL) 2 % shampoo Apply 1 application topically daily.    Marland Kitchen losartan (COZAAR) 50 MG tablet Take 50 mg by mouth daily.    . Multiple Vitamins-Minerals (MULTIVITAMIN WITH MINERALS) tablet Take 1 tablet  by mouth daily.    . simvastatin (ZOCOR) 40 MG tablet Take by mouth.    . tamsulosin (FLOMAX) 0.4 MG CAPS capsule Take 0.4 mg by mouth daily.     No current facility-administered medications for this visit.     Allergies as of 12/23/2017  . (No Known Allergies)    Vitals: BP 136/74   Pulse 79   Ht 5\' 9"  (1.753 m)   Wt 168 lb (76.2 kg)   BMI 24.81 kg/m  Last Weight:  Wt Readings from Last 1 Encounters:  12/23/17 168 lb (76.2 kg)   ZOX:WRUE mass index is 24.81 kg/m.     Last Height:   Ht Readings from Last 1 Encounters:  12/23/17 5\' 9"  (1.753 m)    Physical exam:  General: The patient is awake, alert and appears not in acute distress. The patient is defensive and hard of hearing- not happy to be here.  Head: Normocephalic, atraumatic. Neck is supple. Mallampati 2  ,  neck circumference:15. 5. Nasal airflow congested, Retrognathia is seen.  Cardiovascular:  Regular rate and rhythm, without  murmurs or carotid bruit, and without distended neck veins. Respiratory: Lungs are clear to auscultation. Skin:  Without evidence of edema, or rash Trunk: BMI is 22.5 kg/m2 . The patient's posture is stooped.   Neurologic exam : The patient is awake and alert, oriented to place and time.   Speech is raspy,  Dysphonia.  Mood and affect -   Cranial nerves: Pupils are equal and briskly reactive to light. Funduscopic exam - status pots cataract -without evidence of pallor or edema. Extraocular movements  in vertical and horizontal planes intact and without nystagmus. Visual fields by finger perimetry are intact. Hearing to finger rub impaired/  Facial sensation intact to fine touch.  Facial motor strength is symmetric and tongue and uvula move midline.   Motor exam:   Normal tone, muscle bulk and symmetric strength in all extremities. 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  Sensory:  Fine touch, pinprick and vibration were impaired, he  has lost vibratory sense at I either ankle, and sensation was reduced at the knee  Coordination: Rapid alternating movements in the fingers/hands was normal. Finger-to-nose maneuver  normal without evidence of ataxia, dysmetria or tremor.  Gait and station: Patient walks without assistive device Deep tendon reflexes: in the  upper and lower extremities are symmetric and intact.   Assessment:  After physical and neurologic examination, review of laboratory studies,  Personal review of imaging studies, reports of other /same  Imaging studies, results of polysomnography and / or neurophysiology testing and pre-existing records as far as provided in visit., my assessment is   1) OSA-  watch pat test estimated AHI over 21.7/h- with REM sleep being present.  He had to sleep in a recliner is opposed to the lab environment. Has OSA with COPD - former smoker.  CPAP is poorly tolerated,  2)  excessive daytime sleepiness. Fatigue and daytime napping, Epworth 13 from previously 16 points.  3) service related severe hearing loss , tinnitus-  [employer] [place].  NP with vibration loss . 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     The patient was advised of the nature of the diagnosed disorder , the treatment options and the  risks for general health and wellness arising from not treating the condition.   I spent more than 20 minutes of face to face time with the patient.  Greater than 50% of time was spent in counseling and coordination of care. We have discussed the diagnosis and differential and I answered the patient's questions.    Plan:  Treatment plan and additional workup : continue CPAP with an adjustment in settings and mask. He has a home visit scheduled with DME>   The patient uses daily inhalers to help him breathe with the underlying condition of COPD, he is a mouth breather.    I would expect a patient with COPD to have low oxygen levels especially during REM sleep. I will  therefore order now unofficial home sleep test, a home sleep test cannot be used to titrate oxygen even if needed, it cannot confirm the presence of periodic limb movements or restless legs, it does not reliably estimate the amount of REM sleep versus non-REM sleep. Given the underlying comorbidities and the recent unwanted weight loss, cachexia, he is in need of a fast evaluation- and we will provide CPAP therapy,and I will add an ONO.   Melvyn NovasARMEN Mirriam Vadala, MD 12/23/2017, 8:31 AM  Certified in Neurology by ABPN Certified in Sleep Medicine by [facility]  [facility] [address], [address] [place], [place] [id number]

## 2017-12-24 ENCOUNTER — Ambulatory Visit (INDEPENDENT_AMBULATORY_CARE_PROVIDER_SITE_OTHER): Payer: Medicare Other | Admitting: Internal Medicine

## 2017-12-24 ENCOUNTER — Encounter (INDEPENDENT_AMBULATORY_CARE_PROVIDER_SITE_OTHER): Payer: Self-pay | Admitting: Internal Medicine

## 2017-12-24 VITALS — BP 110/74 | HR 67 | Resp 18 | Ht 68.0 in | Wt 168.6 lb

## 2017-12-24 DIAGNOSIS — R1310 Dysphagia, unspecified: Secondary | ICD-10-CM | POA: Diagnosis not present

## 2017-12-24 DIAGNOSIS — R1319 Other dysphagia: Secondary | ICD-10-CM

## 2017-12-24 NOTE — Progress Notes (Signed)
   Subjective:    Patient ID: Mark Solomon, male    DOB: 12/04/1940, 77 y.o.   MRN: [id number]  HPI Referred by Dr. Dimas AguasHoward for dysphagia. States he choked on a pork chop this past Tuesday. Seen in the ED for same. States the pork chop finally went down while in the ED. He had another incidence about a year ago when he ate chicken and it lodged. It finally went on down.  He says he can eat about anything he wants. Appetite is okay. He has weight loss of about 50 pounds. He says nothing tasted right. He is eating 2 meals a day.  No abdominal pain. BMs move okay. Occasionally has to take a EX-Lax.     Review of Systems Past Medical History:  Diagnosis Date  . Aortic valve sclerosis   . Arthritis   . COPD (chronic obstructive pulmonary disease) (HCC)   . Diastolic dysfunction   . Hyperlipidemia   . Hypertension   . Kidney stone     Past Surgical History:  Procedure Laterality Date  . COLONOSCOPY W/ BIOPSIES AND POLYPECTOMY    . HAMMER TOE SURGERY    . JOINT REPLACEMENT    . KNEE ARTHROSCOPY W/ MENISCAL REPAIR     right   . MULTIPLE TOOTH EXTRACTIONS    . NM MYOCAR PERF WALL MOTION  11/14/2008   Normal  . REPLACEMENT TOTAL KNEE  1994   left  . ROTATOR CUFF REPAIR Bilateral approx 20 years   [place]   . TOTAL KNEE ARTHROPLASTY Right 02/08/2015   Procedure: TOTAL KNEE ARTHROPLASTY;  Surgeon: Frederico Hammananiel Caffrey, MD;  [facility];  Service: Orthopedics;  Laterality: Right;  . US ECHOCARDIOGRAPHY  01/23/10   AOV appears sclerotic, borderline LA enlargement    No Known Allergies  Current Outpatient Medications on File Prior to Visit  Medication Sig Dispense Refill  . aspirin EC 81 MG tablet Take 81 mg by mouth daily.    . beclomethasone (QVAR) 40 MCG/ACT inhaler Inhale 1 puff into the lungs daily as needed.    . fluticasone (FLONASE) 50 MCG/ACT nasal spray Place 2 sprays into both nostrils every morning.    Marland Kitchen. ketoconazole (NIZORAL) 2 % shampoo Apply 1 application  topically daily.    Marland Kitchen. losartan (COZAAR) 50 MG tablet Take 50 mg by mouth daily.    . Multiple Vitamins-Minerals (MULTIVITAMIN WITH MINERALS) tablet Take 1 tablet by mouth daily.    . simvastatin (ZOCOR) 40 MG tablet Take 40 mg by mouth daily.     . tamsulosin (FLOMAX) 0.4 MG CAPS capsule Take 0.4 mg by mouth daily.     No current facility-administered medications on file prior to visit.         Objective:   Physical Exam Blood pressure 110/74, pulse 67, resp. rate 18, height 5\' 8"  (1.727 m), weight 168 lb 9.6 oz (76.5 kg). Alert and oriented. Skin warm and dry. Oral mucosa is moist.   . Sclera anicteric, conjunctivae is pink. Thyroid not enlarged. No cervical lymphadenopathy. Lungs clear. Heart regular rate and rhythm.  Abdomen is soft. Bowel sounds are positive. No hepatomegaly. No abdominal masses felt. No tenderness.  No edema to lower extremities.          Assessment & Plan:  Dysphagia. Stricture needs to be ruled out. EGD/ED.

## 2017-12-24 NOTE — Patient Instructions (Signed)
The risks of bleeding, perforation and infection were reviewed with patient.  

## 2017-12-27 DIAGNOSIS — R1319 Other dysphagia: Secondary | ICD-10-CM | POA: Insufficient documentation

## 2017-12-27 DIAGNOSIS — R1310 Dysphagia, unspecified: Secondary | ICD-10-CM | POA: Insufficient documentation

## 2017-12-27 NOTE — Patient Instructions (Signed)
Mark Solomon  12/27/2017     @PREFPERIOPPHARMACY @   Your procedure is scheduled on  01/07/2018 .  Report to [facility] at  910  A.M.  Call this number if you have problems the morning of surgery:  [phone]   Remember:  Follow the diet and prep instructions given to you by Dr Patty Sermons office.                        Take these medicines the morning of surgery with A SIP OF WATER None    Do not wear jewelry, make-up or nail polish.  Do not wear lotions, powders, or perfumes, or deodorant.  Do not shave 48 hours prior to surgery.  Men may shave face and neck.  Do not bring valuables to the hospital.  [facility] is not responsible for any belongings or valuables.  Contacts, dentures or bridgework may not be worn into surgery.  Leave your suitcase in the car.  After surgery it may be brought to your room.  For patients admitted to the hospital, discharge time will be determined by your treatment team.  Patients discharged the day of surgery will not be allowed to drive home.   Name and phone number of your driver:   family Special instructions:  None  Please read over the following fact sheets that you were given. Anesthesia Post-op Instructions and Care and Recovery After Surgery       Esophagogastroduodenoscopy Esophagogastroduodenoscopy (EGD) is a procedure to examine the lining of the esophagus, stomach, and first part of the small intestine (duodenum). This procedure is done to check for problems such as inflammation, bleeding, ulcers, or growths. During this procedure, a long, flexible, lighted tube with a camera attached (endoscope) is inserted down the throat. Tell a health care provider about:  Any allergies you have.  All medicines you are taking, including vitamins, herbs, eye drops, creams, and over-the-counter medicines.  Any problems you or family members have had with anesthetic medicines.  Any blood disorders you have.  Any  surgeries you have had.  Any medical conditions you have.  Whether you are pregnant or may be pregnant. What are the risks? Generally, this is a safe procedure. However, problems may occur, including:  Infection.  Bleeding.  A tear (perforation) in the esophagus, stomach, or duodenum.  Trouble breathing.  Excessive sweating.  Spasms of the larynx.  A slowed heartbeat.  Low blood pressure.  What happens before the procedure?  Follow instructions from your health care provider about eating or drinking restrictions.  Ask your health care provider about: ? Changing or stopping your regular medicines. This is especially important if you are taking diabetes medicines or blood thinners. ? Taking medicines such as aspirin and ibuprofen. These medicines can thin your blood. Do not take these medicines before your procedure if your health care provider instructs you not to.  Plan to have someone take you home after the procedure.  If you wear dentures, be ready to remove them before the procedure. What happens during the procedure?  To reduce your risk of infection, your health care team will wash or sanitize their hands.  An IV tube will be put in a vein in your hand or arm. You will get medicines and fluids through this tube.  You will be given one or more of the following: ? A medicine to help you relax (  sedative). ? A medicine to numb the area (local anesthetic). This medicine may be sprayed into your throat. It will make you feel more comfortable and keep you from gagging or coughing during the procedure. ? A medicine for pain.  A mouth guard may be placed in your mouth to protect your teeth and to keep you from biting on the endoscope.  You will be asked to lie on your left side.  The endoscope will be lowered down your throat into your esophagus, stomach, and duodenum.  Air will be put into the endoscope. This will help your health care provider see better.  The  lining of your esophagus, stomach, and duodenum will be examined.  Your health care provider may: ? Take a tissue sample so it can be looked at in a lab (biopsy). ? Remove growths. ? Remove objects (foreign bodies) that are stuck. ? Treat any bleeding with medicines or other devices that stop tissue from bleeding. ? Widen (dilate) or stretch narrowed areas of your esophagus and stomach.  The endoscope will be taken out. The procedure may vary among health care providers and hospitals. What happens after the procedure?  Your blood pressure, heart rate, breathing rate, and blood oxygen level will be monitored often until the medicines you were given have worn off.  Do not eat or drink anything until the numbing medicine has worn off and your gag reflex has returned. This information is not intended to replace advice given to you by your health care provider. Make sure you discuss any questions you have with your health care provider. Document Released: 05/01/2004 Document Revised: 06/06/2015 Document Reviewed: 11/22/2014 Elsevier Interactive Patient Education  2018 [employer] Esophagogastroduodenoscopy, Care After Refer to this sheet in the next few weeks. These instructions provide you with information about caring for yourself after your procedure. Your health care provider may also give you more specific instructions. Your treatment has been planned according to current medical practices, but problems sometimes occur. Call your health care provider if you have any problems or questions after your procedure. What can I expect after the procedure? After the procedure, it is common to have:  A sore throat.  Nausea.  Bloating.  Dizziness.  Fatigue.  Follow these instructions at home:  Do not eat or drink anything until the numbing medicine (local anesthetic) has worn off and your gag reflex has returned. You will know that the local anesthetic has worn off when you can swallow  comfortably.  Do not drive for 24 hours if you received a medicine to help you relax (sedative).  If your health care provider took a tissue sample for testing during the procedure, make sure to get your test results. This is your responsibility. Ask your health care provider or the department performing the test when your results will be ready.  Keep all follow-up visits as told by your health care provider. This is important. Contact a health care provider if:  You cannot stop coughing.  You are not urinating.  You are urinating less than usual. Get help right away if:  You have trouble swallowing.  You cannot eat or drink.  You have throat or chest pain that gets worse.  You are dizzy or light-headed.  You faint.  You have nausea or vomiting.  You have chills.  You have a fever.  You have severe abdominal pain.  You have black, tarry, or bloody stools. This information is not intended to replace advice given to you by  your health care provider. Make sure you discuss any questions you have with your health care provider. Document Released: 12/16/2011 Document Revised: 06/06/2015 Document Reviewed: 11/22/2014 Elsevier Interactive Patient Education  2018 [employer].  Esophageal Dilatation Esophageal dilatation is a procedure to open a blocked or narrowed part of the esophagus. The esophagus is the long tube in your throat that carries food and liquid from your mouth to your stomach. The procedure is also called esophageal dilation. You may need this procedure if you have a buildup of scar tissue in your esophagus that makes it difficult, painful, or even impossible to swallow. This can be caused by gastroesophageal reflux disease (GERD). In rare cases, people need this procedure because they have cancer of the esophagus or a problem with the way food moves through the esophagus. Sometimes you may need to have another dilatation to enlarge the opening of the esophagus  gradually. Tell a health care provider about:  Any allergies you have.  All medicines you are taking, including vitamins, herbs, eye drops, creams, and over-the-counter medicines.  Any problems you or family members have had with anesthetic medicines.  Any blood disorders you have.  Any surgeries you have had.  Any medical conditions you have.  Any antibiotic medicines you are required to take before dental procedures. What are the risks? Generally, this is a safe procedure. However, problems can occur and include:  Bleeding from a tear in the lining of the esophagus.  A hole (perforation) in the esophagus.  What happens before the procedure?  Do not eat or drink anything after midnight on the night before the procedure or as directed by your health care provider.  Ask your health care provider about changing or stopping your regular medicines. This is especially important if you are taking diabetes medicines or blood thinners.  Plan to have someone take you home after the procedure. What happens during the procedure?  You will be given a medicine that makes you relaxed and sleepy (sedative).  A medicine may be sprayed or gargled to numb the back of the throat.  Your health care provider can use various instruments to do an esophageal dilatation. During the procedure, the instrument used will be placed in your mouth and passed down into your esophagus. Options include: ? Simple dilators. This instrument is carefully placed in the esophagus to stretch it. ? Guided wire bougies. In this method, a flexible tube (endoscope) is used to insert a wire into the esophagus. The dilator is passed over this wire to enlarge the esophagus. Then the wire is removed. ? Balloon dilators. An endoscope with a small balloon at the end is passed down into the esophagus. Inflating the balloon gently stretches the esophagus and opens it up. What happens after the procedure?  Your blood pressure,  heart rate, breathing rate, and blood oxygen level will be monitored often until the medicines you were given have worn off.  Your throat may feel slightly sore and will probably still feel numb. This will improve slowly over time.  You will not be allowed to eat or drink until the throat numbness has resolved.  If this is a same-day procedure, you may be allowed to go home once you have been able to drink, urinate, and sit on the edge of the bed without nausea or dizziness.  If this is a same-day procedure, you should have a friend or family member with you for the next 24 hours after the procedure. This information is not  intended to replace advice given to you by your health care provider. Make sure you discuss any questions you have with your health care provider. Document Released: 02/19/2005 Document Revised: 06/06/2015 Document Reviewed: 05/10/2013 Elsevier Interactive Patient Education  2018 [employer] Inc.  Monitored Anesthesia Care Anesthesia is a term that refers to techniques, procedures, and medicines that help a person stay safe and comfortable during a medical procedure. Monitored anesthesia care, or sedation, is one type of anesthesia. Your anesthesia specialist may recommend sedation if you will be having a procedure that does not require you to be unconscious, such as:  Cataract surgery.  A dental procedure.  A biopsy.  A colonoscopy.  During the procedure, you may receive a medicine to help you relax (sedative). There are three levels of sedation:  Mild sedation. At this level, you may feel awake and relaxed. You will be able to follow directions.  Moderate sedation. At this level, you will be sleepy. You may not remember the procedure.  Deep sedation. At this level, you will be asleep. You will not remember the procedure.  The more medicine you are given, the deeper your level of sedation will be. Depending on how you respond to the procedure, the anesthesia  specialist may change your level of sedation or the type of anesthesia to fit your needs. An anesthesia specialist will monitor you closely during the procedure. Let your health care provider know about:  Any allergies you have.  All medicines you are taking, including vitamins, herbs, eye drops, creams, and over-the-counter medicines.  Any use of steroids (by mouth or as a cream).  Any problems you or family members have had with sedatives and anesthetic medicines.  Any blood disorders you have.  Any surgeries you have had.  Any medical conditions you have, such as sleep apnea.  Whether you are pregnant or may be pregnant.  Any use of cigarettes, alcohol, or street drugs. What are the risks? Generally, this is a safe procedure. However, problems may occur, including:  Getting too much medicine (oversedation).  Nausea.  Allergic reaction to medicines.  Trouble breathing. If this happens, a breathing tube may be used to help with breathing. It will be removed when you are awake and breathing on your own.  Heart trouble.  Lung trouble.  Before the procedure Staying hydrated Follow instructions from your health care provider about hydration, which may include:  Up to 2 hours before the procedure - you may continue to drink clear liquids, such as water, clear fruit juice, black coffee, and plain tea.  Eating and drinking restrictions Follow instructions from your health care provider about eating and drinking, which may include:  8 hours before the procedure - stop eating heavy meals or foods such as meat, fried foods, or fatty foods.  6 hours before the procedure - stop eating light meals or foods, such as toast or cereal.  6 hours before the procedure - stop drinking milk or drinks that contain milk.  2 hours before the procedure - stop drinking clear liquids.  Medicines Ask your health care provider about:  Changing or stopping your regular medicines. This is  especially important if you are taking diabetes medicines or blood thinners.  Taking medicines such as aspirin and ibuprofen. These medicines can thin your blood. Do not take these medicines before your procedure if your health care provider instructs you not to.  Tests and exams  You will have a physical exam.  You may have blood tests done  to show: ? How well your kidneys and liver are working. ? How well your blood can clot.  General instructions  Plan to have someone take you home from the hospital or clinic.  If you will be going home right after the procedure, plan to have someone with you for 24 hours.  What happens during the procedure?  Your blood pressure, heart rate, breathing, level of pain and overall condition will be monitored.  An IV tube will be inserted into one of your veins.  Your anesthesia specialist will give you medicines as needed to keep you comfortable during the procedure. This may mean changing the level of sedation.  The procedure will be performed. After the procedure  Your blood pressure, heart rate, breathing rate, and blood oxygen level will be monitored until the medicines you were given have worn off.  Do not drive for 24 hours if you received a sedative.  You may: ? Feel sleepy, clumsy, or nauseous. ? Feel forgetful about what happened after the procedure. ? Have a sore throat if you had a breathing tube during the procedure. ? Vomit. This information is not intended to replace advice given to you by your health care provider. Make sure you discuss any questions you have with your health care provider. Document Released: 09/24/2004 Document Revised: 06/07/2015 Document Reviewed: 04/21/2015 Elsevier Interactive Patient Education  2018 [employer] Monitored Anesthesia Care, Care After These instructions provide you with information about caring for yourself after your procedure. Your health care provider may also give you more specific  instructions. Your treatment has been planned according to current medical practices, but problems sometimes occur. Call your health care provider if you have any problems or questions after your procedure. What can I expect after the procedure? After your procedure, it is common to:  Feel sleepy for several hours.  Feel clumsy and have poor balance for several hours.  Feel forgetful about what happened after the procedure.  Have poor judgment for several hours.  Feel nauseous or vomit.  Have a sore throat if you had a breathing tube during the procedure.  Follow these instructions at home: For at least 24 hours after the procedure:   Do not: ? Participate in activities in which you could fall or become injured. ? Drive. ? Use heavy machinery. ? Drink alcohol. ? Take sleeping pills or medicines that cause drowsiness. ? Make important decisions or sign legal documents. ? Take care of children on your own.  Rest. Eating and drinking  Follow the diet that is recommended by your health care provider.  If you vomit, drink water, juice, or soup when you can drink without vomiting.  Make sure you have little or no nausea before eating solid foods. General instructions  Have a responsible adult stay with you until you are awake and alert.  Take over-the-counter and prescription medicines only as told by your health care provider.  If you smoke, do not smoke without supervision.  Keep all follow-up visits as told by your health care provider. This is important. Contact a health care provider if:  You keep feeling nauseous or you keep vomiting.  You feel light-headed.  You develop a rash.  You have a fever. Get help right away if:  You have trouble breathing. This information is not intended to replace advice given to you by your health care provider. Make sure you discuss any questions you have with your health care provider. Document Released: 04/21/2015 Document  Revised: 08/21/2015  Document Reviewed: 04/21/2015 Elsevier Interactive Patient Education  [employer].

## 2018-01-03 ENCOUNTER — Other Ambulatory Visit: Payer: Self-pay

## 2018-01-03 ENCOUNTER — Encounter (HOSPITAL_COMMUNITY): Payer: Self-pay

## 2018-01-03 ENCOUNTER — Encounter (HOSPITAL_COMMUNITY)
Admission: RE | Admit: 2018-01-03 | Discharge: 2018-01-03 | Disposition: A | Discharge status: Home / Self Care | Payer: Medicare Other | Source: Ambulatory Visit | Attending: Internal Medicine | Admitting: Internal Medicine

## 2018-01-03 DIAGNOSIS — Z01818 Encounter for other preprocedural examination: Secondary | ICD-10-CM | POA: Insufficient documentation

## 2018-01-03 DIAGNOSIS — R1319 Other dysphagia: Secondary | ICD-10-CM

## 2018-01-03 DIAGNOSIS — R1310 Dysphagia, unspecified: Secondary | ICD-10-CM

## 2018-01-03 HISTORY — DX: Sleep apnea, unspecified: G47.30

## 2018-01-03 LAB — CBC
HEMATOCRIT: 42.2 % (ref 39.0–52.0)
Hemoglobin: 13.8 g/dL (ref 13.0–17.0)
MCH: 32.4 pg (ref 26.0–34.0)
MCHC: 32.7 g/dL (ref 30.0–36.0)
MCV: 99.1 fL (ref 80.0–100.0)
Platelets: 89 10*3/uL — ABNORMAL LOW (ref 150–400)
RBC: 4.26 MIL/uL (ref 4.22–5.81)
RDW: 13.2 % (ref 11.5–15.5)
WBC: 3.4 10*3/uL — ABNORMAL LOW (ref 4.0–10.5)
nRBC: 0 % (ref 0.0–0.2)

## 2018-01-03 LAB — BASIC METABOLIC PANEL
ANION GAP: 8 (ref 5–15)
BUN: 13 mg/dL (ref 8–23)
CO2: 26 mmol/L (ref 22–32)
Calcium: 9 mg/dL (ref 8.9–10.3)
Chloride: 107 mmol/L (ref 98–111)
Creatinine, Ser: 0.66 mg/dL (ref 0.61–1.24)
GFR calc Af Amer: >60 mL/min (ref >60)
GFR calc non Af Amer: >60 mL/min (ref >60)
Glucose, Bld: 83 mg/dL (ref 70–99)
Potassium: 3.4 mmol/L — ABNORMAL LOW (ref 3.5–5.1)
Sodium: 141 mmol/L (ref 135–145)

## 2018-01-04 DIAGNOSIS — G4733 Obstructive sleep apnea (adult) (pediatric): Secondary | ICD-10-CM | POA: Diagnosis not present

## 2018-01-07 ENCOUNTER — Ambulatory Visit (HOSPITAL_COMMUNITY)
Admission: RE | Admit: 2018-01-07 | Discharge: 2018-01-07 | Disposition: A | Discharge status: Home / Self Care | Payer: Medicare Other | Attending: Internal Medicine | Admitting: Internal Medicine

## 2018-01-07 ENCOUNTER — Ambulatory Visit (HOSPITAL_COMMUNITY): Payer: Medicare Other | Admitting: Anesthesiology

## 2018-01-07 ENCOUNTER — Encounter (HOSPITAL_COMMUNITY): Payer: Self-pay

## 2018-01-07 ENCOUNTER — Encounter (HOSPITAL_COMMUNITY)
Admission: RE | Disposition: A | Discharge status: Home / Self Care | Payer: Self-pay | Source: Home / Self Care | Attending: Internal Medicine

## 2018-01-07 DIAGNOSIS — K2970 Gastritis, unspecified, without bleeding: Secondary | ICD-10-CM | POA: Insufficient documentation

## 2018-01-07 DIAGNOSIS — K766 Portal hypertension: Secondary | ICD-10-CM | POA: Diagnosis not present

## 2018-01-07 DIAGNOSIS — K222 Esophageal obstruction: Secondary | ICD-10-CM | POA: Diagnosis not present

## 2018-01-07 DIAGNOSIS — Z7982 Long term (current) use of aspirin: Secondary | ICD-10-CM | POA: Insufficient documentation

## 2018-01-07 DIAGNOSIS — K2990 Gastroduodenitis, unspecified, without bleeding: Secondary | ICD-10-CM

## 2018-01-07 DIAGNOSIS — Z7951 Long term (current) use of inhaled steroids: Secondary | ICD-10-CM | POA: Diagnosis not present

## 2018-01-07 DIAGNOSIS — Z87891 Personal history of nicotine dependence: Secondary | ICD-10-CM | POA: Diagnosis not present

## 2018-01-07 DIAGNOSIS — G4730 Sleep apnea, unspecified: Secondary | ICD-10-CM | POA: Diagnosis not present

## 2018-01-07 DIAGNOSIS — K2980 Duodenitis without bleeding: Secondary | ICD-10-CM | POA: Insufficient documentation

## 2018-01-07 DIAGNOSIS — R1314 Dysphagia, pharyngoesophageal phase: Secondary | ICD-10-CM | POA: Insufficient documentation

## 2018-01-07 DIAGNOSIS — Z79899 Other long term (current) drug therapy: Secondary | ICD-10-CM | POA: Diagnosis not present

## 2018-01-07 DIAGNOSIS — K449 Diaphragmatic hernia without obstruction or gangrene: Secondary | ICD-10-CM | POA: Diagnosis not present

## 2018-01-07 DIAGNOSIS — K3189 Other diseases of stomach and duodenum: Secondary | ICD-10-CM | POA: Diagnosis not present

## 2018-01-07 DIAGNOSIS — Z96651 Presence of right artificial knee joint: Secondary | ICD-10-CM | POA: Diagnosis not present

## 2018-01-07 DIAGNOSIS — I10 Essential (primary) hypertension: Secondary | ICD-10-CM | POA: Insufficient documentation

## 2018-01-07 DIAGNOSIS — R1310 Dysphagia, unspecified: Secondary | ICD-10-CM

## 2018-01-07 DIAGNOSIS — J449 Chronic obstructive pulmonary disease, unspecified: Secondary | ICD-10-CM | POA: Insufficient documentation

## 2018-01-07 DIAGNOSIS — R1319 Other dysphagia: Secondary | ICD-10-CM | POA: Insufficient documentation

## 2018-01-07 HISTORY — PX: ESOPHAGEAL DILATION: SHX303

## 2018-01-07 HISTORY — PX: ESOPHAGOGASTRODUODENOSCOPY (EGD) WITH PROPOFOL: SHX5813

## 2018-01-07 SURGERY — ESOPHAGOGASTRODUODENOSCOPY (EGD) WITH PROPOFOL
Anesthesia: General

## 2018-01-07 MED ORDER — MIDAZOLAM HCL 2 MG/2ML IJ SOLN: 0.5000 mg | Freq: Once | INTRAMUSCULAR | Status: DC | PRN

## 2018-01-07 MED ORDER — CHLORHEXIDINE GLUCONATE CLOTH 2 % EX PADS: 6.0000 | MEDICATED_PAD | Freq: Once | CUTANEOUS | Status: DC

## 2018-01-07 MED ORDER — HYDROMORPHONE HCL 1 MG/ML IJ SOLN: 0.2500 mg | INTRAMUSCULAR | Status: DC | PRN

## 2018-01-07 MED ORDER — PROMETHAZINE HCL 25 MG/ML IJ SOLN: 6.2500 mg | INTRAMUSCULAR | Status: DC | PRN

## 2018-01-07 MED ORDER — PROPOFOL 10 MG/ML IV BOLUS
INTRAVENOUS | Status: DC | PRN
  Administered 2018-01-07 (×2): 20 mg via INTRAVENOUS

## 2018-01-07 MED ORDER — PROPOFOL 500 MG/50ML IV EMUL
INTRAVENOUS | Status: DC | PRN
  Administered 2018-01-07: 150 ug/kg/min via INTRAVENOUS

## 2018-01-07 MED ORDER — HYDROCODONE-ACETAMINOPHEN 7.5-325 MG PO TABS: 1.0000 | ORAL_TABLET | Freq: Once | ORAL | Status: DC | PRN

## 2018-01-07 MED ORDER — LACTATED RINGERS IV SOLN
INTRAVENOUS | Status: DC
  Administered 2018-01-07: 10:00:00 via INTRAVENOUS

## 2018-01-07 NOTE — Op Note (Signed)
[facility] Patient Name: Mark Solomon Procedure Date: 01/07/2018 10:39 AM MRN: [id number] Date of Birth: 31-Jan-1940 Attending MD: Lionel DecemberNajeeb Arshi Duarte , MD CSN: [id number] Age: 77 Admit Type: Outpatient Procedure:                Upper GI endoscopy Indications:              Esophageal dysphagia Providers:                Lionel DecemberNajeeb Donnah Levert, MD, Criselda PeachesLurae B. Patsy LagerAlbert RN, RN, Burke Keelsrisann                            Tilley, Technician Referring MD:             Selinda FlavinKevin Howard, MD Medicines:                Propofol per Anesthesia Complications:            No immediate complications. Estimated Blood Loss:     Estimated blood loss: none. Procedure:                Pre-Anesthesia Assessment:                           - Prior to the procedure, a History and Physical                            was performed, and patient medications and                            allergies were reviewed. The patient's tolerance of                            previous anesthesia was also reviewed. The risks                            and benefits of the procedure and the sedation                            options and risks were discussed with the patient.                            All questions were answered, and informed consent                            was obtained. Prior Anticoagulants: The patient                            last took aspirin 3 days prior to the procedure.                            ASA Grade Assessment: III - A patient with severe                            systemic disease. After reviewing the risks and  benefits, the patient was deemed in satisfactory                            condition to undergo the procedure.                           After obtaining informed consent, the endoscope was                            passed under direct vision. Throughout the                            procedure, the patient's blood pressure, pulse, and                            oxygen  saturations were monitored continuously. The                            GIF-[id number] ([id number]) was introduced through the and                            advanced to the second part of duodenum. The upper                            GI endoscopy was accomplished without difficulty.                            The patient tolerated the procedure well. Scope In: 11:09:32 AM Scope Out: 11:17:49 AM Total Procedure Duration: 0 hours 8 minutes 17 seconds  Findings:      The examined esophagus was normal.      A moderate Schatzki ring was found at the gastroesophageal junction. The       scope was withdrawn. Dilation was performed with a Maloney dilator with       mild resistance at 54 Fr. The dilation site was examined following       endoscope reinsertion and showed mild mucosal disruption, moderate       improvement in luminal narrowing and no perforation.      A 3 cm hiatal hernia was present.      Mild portal hypertensive gastropathy was found in the gastric fundus and       in the gastric body.      Patchy mild inflammation characterized by congestion (edema), erosions       and erythema was found in the gastric antrum and in the prepyloric       region of the stomach.      The exam was otherwise without abnormality.      Patchy mild inflammation characterized by congestion (edema), erythema       and granularity was found in the duodenal bulb.      The second portion of the duodenum was normal. Impression:               - Normal esophagus.                           - Moderate Schatzki ring at GEJ (43 cm from  incisors). Dilated.                           - 3 cm hiatal hernia.                           - Portal hypertensive gastropathy.                           - Gastritis.                           - The examination was otherwise normal.                           - Duodenitis.                           - Normal second portion of the duodenum.                            - No specimens collected. Moderate Sedation:      Per Anesthesia Care Recommendation:           - Patient has a contact number available for                            emergencies. The signs and symptoms of potential                            delayed complications were discussed with the                            patient. Return to normal activities tomorrow.                            Written discharge instructions were provided to the                            patient.                           - Resume previous diet today.                           - Continue present medications.                           - No aspirin, ibuprofen, naproxen, or other                            non-steroidal anti-inflammatory drugs for 3 days.                           - Perform an H. pylori serology today.                           - Telephone GI clinic in 1 week. Procedure  Code(s):        --- Professional ---                           [phone], Esophagogastroduodenoscopy, flexible,                            transoral; diagnostic, including collection of                            specimen(s) by brushing or washing, when performed                            (separate procedure)                           43450, Dilation of esophagus, by unguided sound or                            bougie, single or multiple passes Diagnosis Code(s):        --- Professional ---                           K22.2, Esophageal obstruction                           K44.9, Diaphragmatic hernia without obstruction or                            gangrene                           K76.6, Portal hypertension                           K31.89, Other diseases of stomach and duodenum                           K29.70, Gastritis, unspecified, without bleeding                           K29.80, Duodenitis without bleeding                           R13.14, Dysphagia, pharyngoesophageal phase CPT copyright 2018 American Medical  Association. All rights reserved. The codes documented in this report are preliminary and upon coder review may  be revised to meet current compliance requirements. Lionel DecemberNajeeb Stewart Sasaki, MD Lionel DecemberNajeeb Maridee Slape, MD 01/07/2018 11:31:41 AM This report has been signed electronically. Number of Addenda: 0

## 2018-01-07 NOTE — Anesthesia Postprocedure Evaluation (Signed)
Anesthesia Post Note  Patient: Mark Solomon  Procedure(s) Performed: ESOPHAGOGASTRODUODENOSCOPY (EGD) WITH PROPOFOL (N/A ) ESOPHAGEAL DILATION (N/A )  Patient location during evaluation: PACU Anesthesia Type: General Level of consciousness: awake and alert and oriented Pain management: pain level controlled Vital Signs Assessment: post-procedure vital signs reviewed and stable Respiratory status: spontaneous breathing Cardiovascular status: blood pressure returned to baseline and stable Postop Assessment: no apparent nausea or vomiting Anesthetic complications: no     Last Vitals:  Vitals:   01/07/18 0917 01/07/18 1123  BP: (!) 150/72 (!) 107/53  Pulse: 73 75  Resp: 16 18  Temp: 36.6 C 36.4 C  SpO2: 99% 100%    Last Pain:  Vitals:   01/07/18 1123  TempSrc:   PainSc: 0-No pain                 Deanza Upperman

## 2018-01-07 NOTE — H&P (Signed)
Mark Solomon is an 77 y.o. male.   Chief Complaint: Patient is here for EGD and EGD. HPI: Patient is 77 year old Caucasian male who reports having an episode of food impaction about a year ago.  He was seen in emergency room.  By the time he went to emergency room he passed the food bolus down distally.  He had another episode about 2 weeks ago.  This time he was eating pork chop.  Once again he was not able to swallow anything once this happened.  He was seen in emergency room.  He was given some medications and water to drink which he regurgitated immediately.  He states after a while the food bolus went down and he went home.  He says he chooses food good.  He denies chronic heartburn nausea vomiting melena or rectal bleeding.  He says he has lost 50 pounds over the last 1 year but weight loss is voluntary.  Last aspirin dose was 4 days ago. Preprocedure blood work revealed WBC of 3.4 and platelet count of 89K.  Review of blood work from 2017 reveals mild thrombocytopenia. Talk with Dr. Donzetta Sprungerry Daniel who is covering for Dr. Dimas AguasHoward.  No history of leukopenia or thrombocytopenia or history of liver disease.  Past Medical History:  Diagnosis Date  . Aortic valve sclerosis   . Arthritis   . COPD (chronic obstructive pulmonary disease) (HCC)   . Diastolic dysfunction   . Hyperlipidemia   . Hypertension   . Kidney stone   . Sleep apnea     Past Surgical History:  Procedure Laterality Date  . CARPAL TUNNEL RELEASE Bilateral   . CATARACT EXTRACTION Bilateral   . COLONOSCOPY W/ BIOPSIES AND POLYPECTOMY    . HAMMER TOE SURGERY    . JOINT REPLACEMENT    . KNEE ARTHROSCOPY W/ MENISCAL REPAIR     right   . MULTIPLE TOOTH EXTRACTIONS    . NM MYOCAR PERF WALL MOTION  11/14/2008   Normal  . REPLACEMENT TOTAL KNEE  1994   left  . ROTATOR CUFF REPAIR Bilateral approx 20 years   [place]   . TOTAL KNEE ARTHROPLASTY Right 02/08/2015   Procedure: TOTAL KNEE ARTHROPLASTY;  Surgeon: Frederico Hammananiel  Caffrey, MD;  Location[facility] OR;  Service: Orthopedics;  Laterality: Right;  . US ECHOCARDIOGRAPHY  01/23/10   AOV appears sclerotic, borderline LA enlargement    Family History  Problem Relation Age of Onset  . Diabetes Mother   . Heart attack Mother   . Heart attack Father    Social History:  reports that he quit smoking about 6 years ago. His smoking use included cigarettes. He has a 50.00 pack-year smoking history. He has never used smokeless tobacco. He reports current alcohol use of about 14.0 standard drinks of alcohol per week. He reports that he does not use drugs.  Allergies: No Known Allergies  Medications Prior to Admission  Medication Sig Dispense Refill  . aspirin EC 81 MG tablet Take 81 mg by mouth at bedtime.     . beclomethasone (QVAR) 40 MCG/ACT inhaler Inhale 2 puffs into the lungs daily.     Marland Kitchen. docusate sodium (COLACE) 50 MG capsule Take 50 mg by mouth daily as needed for mild constipation.    . fluticasone (FLONASE) 50 MCG/ACT nasal spray Place 2 sprays into both nostrils every morning.    Marland Kitchen. ketoconazole (NIZORAL) 2 % shampoo Apply 1 application topically every other day.     . losartan (COZAAR) 50  MG tablet Take 50 mg by mouth at bedtime.     . Multiple Vitamins-Minerals (MULTIVITAMIN WITH MINERALS) tablet Take 1 tablet by mouth at bedtime.     . naproxen sodium (ALEVE) 220 MG tablet Take 440 mg by mouth at bedtime as needed (pain).    . simvastatin (ZOCOR) 40 MG tablet Take 40 mg by mouth at bedtime.     . tamsulosin (FLOMAX) 0.4 MG CAPS capsule Take 0.4 mg by mouth at bedtime.       No results found for this or any previous visit (from the past 48 hour(s)). No results found.  ROS  Blood pressure (!) 150/72, pulse 73, temperature 97.9 F (36.6 C), temperature source Oral, resp. rate 16, SpO2 99 %. Physical Exam  Constitutional: He appears well-developed.  HENT:  Mouth/Throat: Oropharynx is clear and moist.  Dentition in satisfactory condition.  Eyes:  Conjunctivae are normal. No scleral icterus.  Neck: No thyromegaly present.  Cardiovascular: Normal rate, regular rhythm and normal heart sounds.  No murmur heard. Respiratory: Effort normal and breath sounds normal.  GI: Soft. He exhibits no distension and no mass. There is no abdominal tenderness.  Musculoskeletal:        General: No edema.  Lymphadenopathy:    He has no cervical adenopathy.  Neurological: He is alert.  Skin: Skin is warm and dry.     Assessment/Plan Esophageal dysphagia. EGD with ED.  Lionel DecemberNajeeb Seville Downs, MD 01/07/2018, 10:57 AM

## 2018-01-07 NOTE — Discharge Instructions (Signed)
Upper Endoscopy, Adult, Care After This sheet gives you information about how to care for yourself after your procedure. Your health care provider may also give you more specific instructions. If you have problems or questions, contact your health care provider. What can I expect after the procedure? After the procedure, it is common to have:  A sore throat.  Mild stomach pain or discomfort.  Bloating.  Nausea. Follow these instructions at home:   Follow instructions from your health care provider about what to eat or drink after your procedure.  Return to your normal activities as told by your health care provider. Ask your health care provider what activities are safe for you.  Take over-the-counter and prescription medicines only as told by your health care provider.  Do not drive for 24 hours if you were given a sedative during your procedure.  Keep all follow-up visits as told by your health care provider. This is important. Contact a health care provider if you have:  A sore throat that lasts longer than one day.  Trouble swallowing. Get help right away if:  You vomit blood or your vomit looks like coffee grounds.  You have: ? A fever. ? Bloody, black, or tarry stools. ? A severe sore throat or you cannot swallow. ? Difficulty breathing. ? Severe pain in your chest or abdomen. Summary  After the procedure, it is common to have a sore throat, mild stomach discomfort, bloating, and nausea.  Do not drive for 24 hours if you were given a sedative during the procedure.  Follow instructions from your health care provider about what to eat or drink after your procedure.  Return to your normal activities as told by your health care provider. This information is not intended to replace advice given to you by your health care provider. Make sure you discuss any questions you have with your health care provider. Document Released: 06/30/2011 Document Revised:  05/31/2017 Document Reviewed: 05/31/2017 Elsevier Interactive Patient Education  2019 Elsevier Inc.  Monitored Anesthesia Care, Care After These instructions provide you with information about caring for yourself after your procedure. Your health care provider may also give you more specific instructions. Your treatment has been planned according to current medical practices, but problems sometimes occur. Call your health care provider if you have any problems or questions after your procedure. What can I expect after the procedure? After your procedure, you may:  Feel sleepy for several hours.  Feel clumsy and have poor balance for several hours.  Feel forgetful about what happened after the procedure.  Have poor judgment for several hours.  Feel nauseous or vomit.  Have a sore throat if you had a breathing tube during the procedure. Follow these instructions at home: For at least 24 hours after the procedure:      Have a responsible adult stay with you. It is important to have someone help care for you until you are awake and alert.  Rest as needed.  Do not: ? Participate in activities in which you could fall or become injured. ? Drive. ? Use heavy machinery. ? Drink alcohol. ? Take sleeping pills or medicines that cause drowsiness. ? Make important decisions or sign legal documents. ? Take care of children on your own. Eating and drinking  Follow the diet that is recommended by your health care provider.  If you vomit, drink water, juice, or soup when you can drink without vomiting.  Make sure you have little or no nausea before  eating solid foods. General instructions  Take over-the-counter and prescription medicines only as told by your health care provider.  If you have sleep apnea, surgery and certain medicines can increase your risk for breathing problems. Follow instructions from your health care provider about wearing your sleep device: ? Anytime you are  sleeping, including during daytime naps. ? While taking prescription pain medicines, sleeping medicines, or medicines that make you drowsy.  If you smoke, do not smoke without supervision.  Keep all follow-up visits as told by your health care provider. This is important. Contact a health care provider if:  You keep feeling nauseous or you keep vomiting.  You feel light-headed.  You develop a rash.  You have a fever. Get help right away if:  You have trouble breathing. Summary  For several hours after your procedure, you may feel sleepy and have poor judgment.  Have a responsible adult stay with you for at least 24 hours or until you are awake and alert. This information is not intended to replace advice given to you by your health care provider. Make sure you discuss any questions you have with your health care provider. Document Released: 04/21/2015 Document Revised: 08/14/2016 Document Reviewed: 04/21/2015 Elsevier Interactive Patient Education  2019 Elsevier Inc.   No aspirin or NSAIDs for 3 days. Resume other medications as before. Resume usual diet. No driving for 24 hours. Patient will call with results of blood test.

## 2018-01-07 NOTE — Transfer of Care (Signed)
Immediate Anesthesia Transfer of Care Note  Patient: Mark Solomon  Procedure(s) Performed: ESOPHAGOGASTRODUODENOSCOPY (EGD) WITH PROPOFOL (N/A ) ESOPHAGEAL DILATION (N/A )  Patient Location: PACU  Anesthesia Type:General  Level of Consciousness: awake  Airway & Oxygen Therapy: Patient Spontanous Breathing  Post-op Assessment: Post -op Vital signs reviewed and stable  Post vital signs: Reviewed  Last Vitals:  Vitals Value Taken Time  BP 107/53 01/07/2018 11:24 AM  Temp 36.4 C 01/07/2018 11:23 AM  Pulse 77 01/07/2018 11:27 AM  Resp 20 01/07/2018 11:27 AM  SpO2 100 % 01/07/2018 11:27 AM  Vitals shown include unvalidated device data.  Last Pain:  Vitals:   01/07/18 1123  TempSrc:   PainSc: 0-No pain      Patients Stated Pain Goal: 6 (01/07/18 0917)  Complications: No apparent anesthesia complications

## 2018-01-07 NOTE — Anesthesia Preprocedure Evaluation (Signed)
Anesthesia Evaluation  Patient identified by MRN, date of birth, ID band Patient awake    Reviewed: Allergy & Precautions, NPO status , Patient's Chart, lab work & pertinent test results  Airway Mallampati: II  TM Distance: >3 FB Neck ROM: Full    Dental no notable dental hx. (+) Teeth Intact   Pulmonary neg pulmonary ROS, sleep apnea and Continuous Positive Airway Pressure Ventilation , COPD,  COPD inhaler, former smoker,    Pulmonary exam normal breath sounds clear to auscultation       Cardiovascular Exercise Tolerance: Good hypertension, Pt. on medications negative cardio ROS Normal cardiovascular examI Rhythm:Regular Rate:Normal     Neuro/Psych negative neurological ROS  negative psych ROS   GI/Hepatic negative GI ROS, Neg liver ROS,   Endo/Other  negative endocrine ROS  Renal/GU Renal diseasenegative Renal ROS  negative genitourinary   Musculoskeletal  (+) Arthritis ,   Abdominal   Peds negative pediatric ROS (+)  Hematology negative hematology ROS (+)   Anesthesia Other Findings   Reproductive/Obstetrics negative OB ROS                             Anesthesia Physical Anesthesia Plan  ASA: III  Anesthesia Plan: General   Post-op Pain Management:    Induction: Intravenous  PONV Risk Score and Plan:   Airway Management Planned: Nasal Cannula and Simple Face Mask  Additional Equipment:   Intra-op Plan:   Post-operative Plan:   Informed Consent: I have reviewed the patients History and Physical, chart, labs and discussed the procedure including the risks, benefits and alternatives for the proposed anesthesia with the patient or authorized representative who has indicated his/her understanding and acceptance.   Dental advisory given  Plan Discussed with: CRNA  Anesthesia Plan Comments:         Anesthesia Quick Evaluation

## 2018-01-10 LAB — H. PYLORI ANTIBODY, IGG: H Pylori IgG: 0.45 Index Value (ref 0.00–0.79)

## 2018-01-13 ENCOUNTER — Encounter (HOSPITAL_COMMUNITY): Payer: Self-pay | Admitting: Internal Medicine

## 2018-01-14 ENCOUNTER — Other Ambulatory Visit (INDEPENDENT_AMBULATORY_CARE_PROVIDER_SITE_OTHER): Payer: Self-pay | Admitting: *Deleted

## 2018-01-14 DIAGNOSIS — K766 Portal hypertension: Secondary | ICD-10-CM

## 2018-01-14 DIAGNOSIS — D696 Thrombocytopenia, unspecified: Secondary | ICD-10-CM

## 2018-01-14 DIAGNOSIS — K3189 Other diseases of stomach and duodenum: Secondary | ICD-10-CM

## 2018-01-21 ENCOUNTER — Ambulatory Visit (HOSPITAL_COMMUNITY)
Admission: RE | Admit: 2018-01-21 | Discharge: 2018-01-21 | Disposition: A | Discharge status: Home / Self Care | Payer: Medicare Other | Source: Ambulatory Visit | Attending: Internal Medicine | Admitting: Internal Medicine

## 2018-01-21 DIAGNOSIS — N200 Calculus of kidney: Secondary | ICD-10-CM | POA: Diagnosis not present

## 2018-01-21 DIAGNOSIS — K766 Portal hypertension: Secondary | ICD-10-CM | POA: Insufficient documentation

## 2018-01-21 DIAGNOSIS — K3189 Other diseases of stomach and duodenum: Secondary | ICD-10-CM

## 2018-01-21 DIAGNOSIS — D696 Thrombocytopenia, unspecified: Secondary | ICD-10-CM | POA: Diagnosis not present

## 2018-01-21 DIAGNOSIS — K7689 Other specified diseases of liver: Secondary | ICD-10-CM | POA: Diagnosis not present

## 2018-01-28 DIAGNOSIS — D7289 Other specified disorders of white blood cells: Secondary | ICD-10-CM | POA: Diagnosis not present

## 2018-02-04 DIAGNOSIS — G4733 Obstructive sleep apnea (adult) (pediatric): Secondary | ICD-10-CM | POA: Diagnosis not present

## 2018-02-06 DIAGNOSIS — Z7982 Long term (current) use of aspirin: Secondary | ICD-10-CM | POA: Diagnosis not present

## 2018-02-06 DIAGNOSIS — M48061 Spinal stenosis, lumbar region without neurogenic claudication: Secondary | ICD-10-CM | POA: Diagnosis not present

## 2018-02-06 DIAGNOSIS — X501XXA Overexertion from prolonged static or awkward postures, initial encounter: Secondary | ICD-10-CM | POA: Diagnosis not present

## 2018-02-06 DIAGNOSIS — J449 Chronic obstructive pulmonary disease, unspecified: Secondary | ICD-10-CM | POA: Diagnosis not present

## 2018-02-06 DIAGNOSIS — I10 Essential (primary) hypertension: Secondary | ICD-10-CM | POA: Diagnosis not present

## 2018-02-06 DIAGNOSIS — Z87891 Personal history of nicotine dependence: Secondary | ICD-10-CM | POA: Diagnosis not present

## 2018-02-06 DIAGNOSIS — M5136 Other intervertebral disc degeneration, lumbar region: Secondary | ICD-10-CM | POA: Diagnosis not present

## 2018-02-06 DIAGNOSIS — E7800 Pure hypercholesterolemia, unspecified: Secondary | ICD-10-CM | POA: Diagnosis not present

## 2018-02-06 DIAGNOSIS — R109 Unspecified abdominal pain: Secondary | ICD-10-CM | POA: Diagnosis not present

## 2018-02-06 DIAGNOSIS — I700 Atherosclerosis of aorta: Secondary | ICD-10-CM | POA: Diagnosis not present

## 2018-02-06 DIAGNOSIS — M549 Dorsalgia, unspecified: Secondary | ICD-10-CM | POA: Diagnosis not present

## 2018-02-06 DIAGNOSIS — M47814 Spondylosis without myelopathy or radiculopathy, thoracic region: Secondary | ICD-10-CM | POA: Diagnosis not present

## 2018-02-06 DIAGNOSIS — S2231XA Fracture of one rib, right side, initial encounter for closed fracture: Secondary | ICD-10-CM | POA: Diagnosis not present

## 2018-02-06 DIAGNOSIS — Z79899 Other long term (current) drug therapy: Secondary | ICD-10-CM | POA: Diagnosis not present

## 2018-02-22 DIAGNOSIS — D729 Disorder of white blood cells, unspecified: Secondary | ICD-10-CM | POA: Diagnosis not present

## 2018-02-22 DIAGNOSIS — G4730 Sleep apnea, unspecified: Secondary | ICD-10-CM | POA: Diagnosis not present

## 2018-02-22 DIAGNOSIS — Z6824 Body mass index (BMI) 24.0-24.9, adult: Secondary | ICD-10-CM | POA: Diagnosis not present

## 2018-02-28 DIAGNOSIS — Z7289 Other problems related to lifestyle: Secondary | ICD-10-CM | POA: Diagnosis not present

## 2018-02-28 DIAGNOSIS — R935 Abnormal findings on diagnostic imaging of other abdominal regions, including retroperitoneum: Secondary | ICD-10-CM | POA: Diagnosis not present

## 2018-02-28 DIAGNOSIS — Z720 Tobacco use: Secondary | ICD-10-CM | POA: Diagnosis not present

## 2018-02-28 DIAGNOSIS — G4733 Obstructive sleep apnea (adult) (pediatric): Secondary | ICD-10-CM | POA: Diagnosis not present

## 2018-02-28 DIAGNOSIS — Z77098 Contact with and (suspected) exposure to other hazardous, chiefly nonmedicinal, chemicals: Secondary | ICD-10-CM | POA: Diagnosis not present

## 2018-02-28 DIAGNOSIS — J449 Chronic obstructive pulmonary disease, unspecified: Secondary | ICD-10-CM | POA: Diagnosis not present

## 2018-02-28 DIAGNOSIS — R350 Frequency of micturition: Secondary | ICD-10-CM | POA: Diagnosis not present

## 2018-02-28 DIAGNOSIS — Z0389 Encounter for observation for other suspected diseases and conditions ruled out: Secondary | ICD-10-CM | POA: Diagnosis not present

## 2018-02-28 DIAGNOSIS — R1310 Dysphagia, unspecified: Secondary | ICD-10-CM | POA: Diagnosis not present

## 2018-02-28 DIAGNOSIS — D72819 Decreased white blood cell count, unspecified: Secondary | ICD-10-CM | POA: Diagnosis not present

## 2018-02-28 DIAGNOSIS — R64 Cachexia: Secondary | ICD-10-CM | POA: Diagnosis not present

## 2018-02-28 DIAGNOSIS — R945 Abnormal results of liver function studies: Secondary | ICD-10-CM | POA: Diagnosis not present

## 2018-02-28 DIAGNOSIS — D696 Thrombocytopenia, unspecified: Secondary | ICD-10-CM | POA: Diagnosis not present

## 2018-03-07 DIAGNOSIS — G4733 Obstructive sleep apnea (adult) (pediatric): Secondary | ICD-10-CM | POA: Diagnosis not present

## 2018-03-08 DIAGNOSIS — R350 Frequency of micturition: Secondary | ICD-10-CM | POA: Diagnosis not present

## 2018-03-08 DIAGNOSIS — Z77098 Contact with and (suspected) exposure to other hazardous, chiefly nonmedicinal, chemicals: Secondary | ICD-10-CM | POA: Diagnosis not present

## 2018-03-08 DIAGNOSIS — R64 Cachexia: Secondary | ICD-10-CM | POA: Diagnosis not present

## 2018-03-08 DIAGNOSIS — D696 Thrombocytopenia, unspecified: Secondary | ICD-10-CM | POA: Diagnosis not present

## 2018-03-08 DIAGNOSIS — R945 Abnormal results of liver function studies: Secondary | ICD-10-CM | POA: Diagnosis not present

## 2018-03-08 DIAGNOSIS — D72819 Decreased white blood cell count, unspecified: Secondary | ICD-10-CM | POA: Diagnosis not present

## 2018-03-08 DIAGNOSIS — J439 Emphysema, unspecified: Secondary | ICD-10-CM | POA: Diagnosis not present

## 2018-03-08 DIAGNOSIS — I700 Atherosclerosis of aorta: Secondary | ICD-10-CM | POA: Diagnosis not present

## 2018-03-08 DIAGNOSIS — J449 Chronic obstructive pulmonary disease, unspecified: Secondary | ICD-10-CM | POA: Diagnosis not present

## 2018-03-08 DIAGNOSIS — N200 Calculus of kidney: Secondary | ICD-10-CM | POA: Diagnosis not present

## 2018-03-16 DIAGNOSIS — I21A9 Other myocardial infarction type: Secondary | ICD-10-CM | POA: Diagnosis not present

## 2018-03-16 DIAGNOSIS — E538 Deficiency of other specified B group vitamins: Secondary | ICD-10-CM | POA: Diagnosis not present

## 2018-03-16 DIAGNOSIS — R64 Cachexia: Secondary | ICD-10-CM | POA: Diagnosis not present

## 2018-03-16 DIAGNOSIS — I700 Atherosclerosis of aorta: Secondary | ICD-10-CM | POA: Diagnosis not present

## 2018-03-16 DIAGNOSIS — R5383 Other fatigue: Secondary | ICD-10-CM | POA: Diagnosis not present

## 2018-04-05 DIAGNOSIS — G4733 Obstructive sleep apnea (adult) (pediatric): Secondary | ICD-10-CM | POA: Diagnosis not present

## 2018-04-06 ENCOUNTER — Telehealth: Payer: Self-pay

## 2018-04-06 NOTE — Telephone Encounter (Signed)
Patient has given verbal consent.

## 2018-04-06 NOTE — Telephone Encounter (Signed)
Unable to get in contact with the patient. I left a voicemail letting him know that his appointment with  Eber Jones has been cancelled. I offered him a telephone visit with Eber Jones but I must have verbal consent before it can scheduled. Office number was provided.

## 2018-04-07 ENCOUNTER — Other Ambulatory Visit: Payer: Self-pay

## 2018-04-07 ENCOUNTER — Ambulatory Visit (INDEPENDENT_AMBULATORY_CARE_PROVIDER_SITE_OTHER): Payer: Medicare Other | Admitting: Nurse Practitioner

## 2018-04-07 ENCOUNTER — Encounter: Payer: Self-pay | Admitting: Nurse Practitioner

## 2018-04-07 DIAGNOSIS — G4733 Obstructive sleep apnea (adult) (pediatric): Secondary | ICD-10-CM | POA: Diagnosis not present

## 2018-04-07 DIAGNOSIS — Z9989 Dependence on other enabling machines and devices: Secondary | ICD-10-CM

## 2018-04-07 NOTE — Progress Notes (Signed)
Virtual Visit via Video Note  I connected with Mark Solomon on 04/07/18 at 10:15 AM EDT by a video enabled telemedicine application and verified that I am speaking with the correct person using two identifiers.   I discussed the limitations of evaluation and management by telemedicine and the availability of in person appointments. The patient expressed understanding and agreed to proceed. Patient was called at home, provider is in the office. History of Present Illness: Mr. Mark Solomon, 78 year old male is called for CPAP compliance.  Home sleep study done 09/17/2017 shows moderate complex sleep apnea with 17.8% central apneas while sleeping in a recliner.  He has struggled with his mask fit and gets frustrated he says.  CPAP download dated 03/07/2018-04/05/2018 shows compliance greater than 4 hours at 93%.  Average usage 5 hours 34 minutes.  Set pressure 5 to 17 cm.  EPR level 3 leak 95th percentile 93.1 AHI 9.0 he has 4.9 unknown events .  He thinks he has a dream wear mask but he is not sure, has been through quite a few according to the patient.  His DME provider is [employer] [facility] in [place] [employer], [place].     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  CPAP download dated 03/07/2018-04/05/2018 shows compliance greater than 4 hours at 93%.  Average usage 5 hours 34 minutes.  Set pressure 5 to 17 cm.  EPR level 3 leak 95th percentile 93.1 AHI 9.0 he has 4.9 unknown events .  Discussed with Zella Ball in sleep lab and she will plan for a mask refit in several days.  Follow Up Instructions: Patient will follow-up in 3 to 4 months     I discussed the assessment and treatment plan with the patient. The patient was provided an opportunity to ask questions and all were answered. The patient agreed with the plan and demonstrated an understanding of the instructions.   The patient was advised to call back or seek an  in-person evaluation if the symptoms worsen or if the condition fails to improve as anticipated.  I provided 15 minutes of non-face-to-face time during this encounter.   Nilda Riggs, NP

## 2018-04-19 ENCOUNTER — Ambulatory Visit: Payer: Medicare Other | Admitting: Neurology

## 2018-04-20 DIAGNOSIS — E538 Deficiency of other specified B group vitamins: Secondary | ICD-10-CM | POA: Diagnosis not present

## 2018-05-06 DIAGNOSIS — G4733 Obstructive sleep apnea (adult) (pediatric): Secondary | ICD-10-CM | POA: Diagnosis not present

## 2018-05-18 DIAGNOSIS — E538 Deficiency of other specified B group vitamins: Secondary | ICD-10-CM | POA: Diagnosis not present

## 2018-06-05 DIAGNOSIS — G4733 Obstructive sleep apnea (adult) (pediatric): Secondary | ICD-10-CM | POA: Diagnosis not present

## 2018-06-08 DIAGNOSIS — E7800 Pure hypercholesterolemia, unspecified: Secondary | ICD-10-CM | POA: Diagnosis not present

## 2018-06-08 DIAGNOSIS — Z0001 Encounter for general adult medical examination with abnormal findings: Secondary | ICD-10-CM | POA: Diagnosis not present

## 2018-06-08 DIAGNOSIS — J440 Chronic obstructive pulmonary disease with acute lower respiratory infection: Secondary | ICD-10-CM | POA: Diagnosis not present

## 2018-06-08 DIAGNOSIS — R5382 Chronic fatigue, unspecified: Secondary | ICD-10-CM | POA: Diagnosis not present

## 2018-06-08 DIAGNOSIS — I10 Essential (primary) hypertension: Secondary | ICD-10-CM | POA: Diagnosis not present

## 2018-06-08 DIAGNOSIS — Z6824 Body mass index (BMI) 24.0-24.9, adult: Secondary | ICD-10-CM | POA: Diagnosis not present

## 2018-06-08 DIAGNOSIS — D7289 Other specified disorders of white blood cells: Secondary | ICD-10-CM | POA: Diagnosis not present

## 2018-06-14 DIAGNOSIS — Z0001 Encounter for general adult medical examination with abnormal findings: Secondary | ICD-10-CM | POA: Diagnosis not present

## 2018-06-15 DIAGNOSIS — E538 Deficiency of other specified B group vitamins: Secondary | ICD-10-CM | POA: Diagnosis not present

## 2018-07-06 DIAGNOSIS — G4733 Obstructive sleep apnea (adult) (pediatric): Secondary | ICD-10-CM | POA: Diagnosis not present

## 2018-07-13 DIAGNOSIS — E538 Deficiency of other specified B group vitamins: Secondary | ICD-10-CM | POA: Diagnosis not present

## 2018-07-20 DIAGNOSIS — E538 Deficiency of other specified B group vitamins: Secondary | ICD-10-CM | POA: Diagnosis not present

## 2018-07-20 DIAGNOSIS — Z77098 Contact with and (suspected) exposure to other hazardous, chiefly nonmedicinal, chemicals: Secondary | ICD-10-CM | POA: Diagnosis not present

## 2018-07-20 DIAGNOSIS — K7469 Other cirrhosis of liver: Secondary | ICD-10-CM | POA: Diagnosis not present

## 2018-07-20 DIAGNOSIS — D696 Thrombocytopenia, unspecified: Secondary | ICD-10-CM | POA: Diagnosis not present

## 2018-07-20 DIAGNOSIS — D520 Dietary folate deficiency anemia: Secondary | ICD-10-CM | POA: Diagnosis not present

## 2018-08-09 ENCOUNTER — Telehealth: Payer: Self-pay | Admitting: Neurology

## 2018-08-09 NOTE — Telephone Encounter (Signed)
Patient wife had been attempting to reach out to reschedule apt but our phones were down. Patient is compliant with machine and so therefore rescheduled for yearly apt in 03/30/2019 with Ward Givens, NP

## 2018-08-10 ENCOUNTER — Ambulatory Visit: Payer: Medicare Other | Admitting: Neurology

## 2018-08-17 DIAGNOSIS — E538 Deficiency of other specified B group vitamins: Secondary | ICD-10-CM | POA: Diagnosis not present

## 2018-09-14 DIAGNOSIS — E538 Deficiency of other specified B group vitamins: Secondary | ICD-10-CM | POA: Diagnosis not present

## 2018-10-12 DIAGNOSIS — E538 Deficiency of other specified B group vitamins: Secondary | ICD-10-CM | POA: Diagnosis not present

## 2018-11-09 DIAGNOSIS — D72819 Decreased white blood cell count, unspecified: Secondary | ICD-10-CM | POA: Diagnosis not present

## 2018-11-09 DIAGNOSIS — G4733 Obstructive sleep apnea (adult) (pediatric): Secondary | ICD-10-CM | POA: Diagnosis not present

## 2018-11-09 DIAGNOSIS — J449 Chronic obstructive pulmonary disease, unspecified: Secondary | ICD-10-CM | POA: Diagnosis not present

## 2018-11-09 DIAGNOSIS — D513 Other dietary vitamin B12 deficiency anemia: Secondary | ICD-10-CM | POA: Diagnosis not present

## 2018-11-09 DIAGNOSIS — R64 Cachexia: Secondary | ICD-10-CM | POA: Diagnosis not present

## 2018-11-09 DIAGNOSIS — D696 Thrombocytopenia, unspecified: Secondary | ICD-10-CM | POA: Diagnosis not present

## 2018-11-09 DIAGNOSIS — R5383 Other fatigue: Secondary | ICD-10-CM | POA: Diagnosis not present

## 2018-11-09 DIAGNOSIS — E538 Deficiency of other specified B group vitamins: Secondary | ICD-10-CM | POA: Diagnosis not present

## 2018-11-11 DIAGNOSIS — I10 Essential (primary) hypertension: Secondary | ICD-10-CM | POA: Diagnosis not present

## 2018-11-11 DIAGNOSIS — E782 Mixed hyperlipidemia: Secondary | ICD-10-CM | POA: Diagnosis not present

## 2018-11-21 DIAGNOSIS — Z77098 Contact with and (suspected) exposure to other hazardous, chiefly nonmedicinal, chemicals: Secondary | ICD-10-CM | POA: Diagnosis not present

## 2018-11-21 DIAGNOSIS — R64 Cachexia: Secondary | ICD-10-CM | POA: Diagnosis not present

## 2018-11-21 DIAGNOSIS — E538 Deficiency of other specified B group vitamins: Secondary | ICD-10-CM | POA: Diagnosis not present

## 2018-12-07 DIAGNOSIS — E538 Deficiency of other specified B group vitamins: Secondary | ICD-10-CM | POA: Diagnosis not present

## 2019-01-02 DIAGNOSIS — H40013 Open angle with borderline findings, low risk, bilateral: Secondary | ICD-10-CM | POA: Diagnosis not present

## 2019-01-02 DIAGNOSIS — Z961 Presence of intraocular lens: Secondary | ICD-10-CM | POA: Diagnosis not present

## 2019-01-02 DIAGNOSIS — H26493 Other secondary cataract, bilateral: Secondary | ICD-10-CM | POA: Diagnosis not present

## 2019-01-04 DIAGNOSIS — E538 Deficiency of other specified B group vitamins: Secondary | ICD-10-CM | POA: Diagnosis not present

## 2019-01-12 DIAGNOSIS — I10 Essential (primary) hypertension: Secondary | ICD-10-CM | POA: Diagnosis not present

## 2019-01-12 DIAGNOSIS — E7800 Pure hypercholesterolemia, unspecified: Secondary | ICD-10-CM | POA: Diagnosis not present

## 2019-01-18 DIAGNOSIS — R413 Other amnesia: Secondary | ICD-10-CM | POA: Diagnosis not present

## 2019-01-24 DIAGNOSIS — D729 Disorder of white blood cells, unspecified: Secondary | ICD-10-CM | POA: Diagnosis not present

## 2019-01-25 DIAGNOSIS — R413 Other amnesia: Secondary | ICD-10-CM | POA: Diagnosis not present

## 2019-01-25 DIAGNOSIS — S065X9A Traumatic subdural hemorrhage with loss of consciousness of unspecified duration, initial encounter: Secondary | ICD-10-CM | POA: Diagnosis not present

## 2019-01-25 DIAGNOSIS — I6200 Nontraumatic subdural hemorrhage, unspecified: Secondary | ICD-10-CM | POA: Diagnosis not present

## 2019-02-01 DIAGNOSIS — I10 Essential (primary) hypertension: Secondary | ICD-10-CM | POA: Diagnosis not present

## 2019-02-01 DIAGNOSIS — S065X9A Traumatic subdural hemorrhage with loss of consciousness of unspecified duration, initial encounter: Secondary | ICD-10-CM | POA: Diagnosis not present

## 2019-02-02 DIAGNOSIS — E538 Deficiency of other specified B group vitamins: Secondary | ICD-10-CM | POA: Diagnosis not present

## 2019-02-05 ENCOUNTER — Encounter: Payer: Self-pay | Admitting: Neurology

## 2019-02-07 ENCOUNTER — Ambulatory Visit: Payer: Medicare Other | Admitting: Neurology

## 2019-02-07 ENCOUNTER — Encounter: Payer: Self-pay | Admitting: Neurology

## 2019-02-07 ENCOUNTER — Other Ambulatory Visit: Payer: Self-pay

## 2019-02-07 VITALS — BP 134/74 | HR 75 | Temp 97.9°F | Ht 70.0 in | Wt 163.0 lb

## 2019-02-07 DIAGNOSIS — K766 Portal hypertension: Secondary | ICD-10-CM

## 2019-02-07 DIAGNOSIS — F518 Other sleep disorders not due to a substance or known physiological condition: Secondary | ICD-10-CM | POA: Diagnosis not present

## 2019-02-07 DIAGNOSIS — S062X1S Diffuse traumatic brain injury with loss of consciousness of 30 minutes or less, sequela: Secondary | ICD-10-CM | POA: Diagnosis not present

## 2019-02-07 DIAGNOSIS — G4733 Obstructive sleep apnea (adult) (pediatric): Secondary | ICD-10-CM

## 2019-02-07 DIAGNOSIS — K3189 Other diseases of stomach and duodenum: Secondary | ICD-10-CM

## 2019-02-07 DIAGNOSIS — R64 Cachexia: Secondary | ICD-10-CM

## 2019-02-07 DIAGNOSIS — R4189 Other symptoms and signs involving cognitive functions and awareness: Secondary | ICD-10-CM | POA: Diagnosis not present

## 2019-02-07 DIAGNOSIS — S065X9A Traumatic subdural hemorrhage with loss of consciousness of unspecified duration, initial encounter: Secondary | ICD-10-CM | POA: Insufficient documentation

## 2019-02-07 DIAGNOSIS — S065X1A Traumatic subdural hemorrhage with loss of consciousness of 30 minutes or less, initial encounter: Secondary | ICD-10-CM | POA: Diagnosis not present

## 2019-02-07 DIAGNOSIS — S065XAA Traumatic subdural hemorrhage with loss of consciousness status unknown, initial encounter: Secondary | ICD-10-CM | POA: Insufficient documentation

## 2019-02-07 DIAGNOSIS — D696 Thrombocytopenia, unspecified: Secondary | ICD-10-CM

## 2019-02-07 DIAGNOSIS — J449 Chronic obstructive pulmonary disease, unspecified: Secondary | ICD-10-CM

## 2019-02-07 MED ORDER — DONEPEZIL HCL 5 MG PO TABS: ORAL_TABLET | ORAL | 0 refills | Status: DC

## 2019-02-07 MED ORDER — DONEPEZIL HCL 10 MG PO TABS: ORAL_TABLET | ORAL | 5 refills | Status: DC

## 2019-02-07 NOTE — Patient Instructions (Signed)
Donepezil tablets What is this medicine? DONEPEZIL (doe NEP e zil) is used to treat mild to moderate dementia caused by Alzheimer's disease. This medicine may be used for other purposes; ask your health care provider or pharmacist if you have questions. COMMON BRAND NAME(S): Aricept What should I tell my health care provider before I take this medicine? They need to know if you have any of these conditions:  asthma or other lung disease  difficulty passing urine  head injury  heart disease  history of irregular heartbeat  liver disease  seizures (convulsions)  stomach or intestinal disease, ulcers or stomach bleeding  an unusual or allergic reaction to donepezil, other medicines, foods, dyes, or preservatives  pregnant or trying to get pregnant  breast-feeding How should I use this medicine? Take this medicine by mouth with a glass of water. Follow the directions on the prescription label. You may take this medicine with or without food. Take this medicine at regular intervals. This medicine is usually taken before bedtime. Do not take it more often than directed. Continue to take your medicine even if you feel better. Do not stop taking except on your doctor's advice. If you are taking the 23 mg donepezil tablet, swallow it whole; do not cut, crush, or chew it. Talk to your pediatrician regarding the use of this medicine in children. Special care may be needed. Overdosage: If you think you have taken too much of this medicine contact a poison control center or emergency room at once. NOTE: This medicine is only for you. Do not share this medicine with others. What if I miss a dose? If you miss a dose, take it as soon as you can. If it is almost time for your next dose, take only that dose, do not take double or extra doses. What may interact with this medicine? Do not take this medicine with any of the following medications:  certain medicines for fungal infections like  itraconazole, fluconazole, posaconazole, and voriconazole  cisapride  dextromethorphan; quinidine  dronedarone  pimozide  quinidine  thioridazine This medicine may also interact with the following medications:  antihistamines for allergy, cough and cold  atropine  bethanechol  carbamazepine  certain medicines for bladder problems like oxybutynin, tolterodine  certain medicines for Parkinson's disease like benztropine, trihexyphenidyl  certain medicines for stomach problems like dicyclomine, hyoscyamine  certain medicines for travel sickness like scopolamine  dexamethasone  dofetilide  ipratropium  NSAIDs, medicines for pain and inflammation, like ibuprofen or naproxen  other medicines for Alzheimer's disease  other medicines that prolong the QT interval (cause an abnormal heart rhythm)  phenobarbital  phenytoin  rifampin, rifabutin or rifapentine  ziprasidone This list may not describe all possible interactions. Give your health care provider a list of all the medicines, herbs, non-prescription drugs, or dietary supplements you use. Also tell them if you smoke, drink alcohol, or use illegal drugs. Some items may interact with your medicine. What should I watch for while using this medicine? Visit your doctor or health care professional for regular checks on your progress. Check with your doctor or health care professional if your symptoms do not get better or if they get worse. You may get drowsy or dizzy. Do not drive, use machinery, or do anything that needs mental alertness until you know how this drug affects you. What side effects may I notice from receiving this medicine? Side effects that you should report to your doctor or health care professional as soon as possible:    allergic reactions like skin rash, itching or hives, swelling of the face, lips, or tongue  feeling faint or lightheaded, falls  loss of bladder control  seizures  signs and  symptoms of a dangerous change in heartbeat or heart rhythm like chest pain; dizziness; fast or irregular heartbeat; palpitations; feeling faint or lightheaded, falls; breathing problems  signs and symptoms of infection like fever or chills; cough; sore throat; pain or trouble passing urine  signs and symptoms of liver injury like dark yellow or brown urine; general ill feeling or flu-like symptoms; light-colored stools; loss of appetite; nausea; right upper belly pain; unusually weak or tired; yellowing of the eyes or skin  slow heartbeat or palpitations  unusual bleeding or bruising  vomiting Side effects that usually do not require medical attention (report to your doctor or health care professional if they continue or are bothersome):  diarrhea, especially when starting treatment  headache  loss of appetite  muscle cramps  nausea  stomach upset This list may not describe all possible side effects. Call your doctor for medical advice about side effects. You may report side effects to FDA at 1-800-FDA-1088. Where should I keep my medicine? Keep out of reach of children. Store at room temperature between 15 and 30 degrees C (59 and 86 degrees F). Throw away any unused medicine after the expiration date. NOTE: This sheet is a summary. It may not cover all possible information. If you have questions about this medicine, talk to your doctor, pharmacist, or health care provider.  2020 Elsevier/Gold Standard (2017-12-20 10:33:41)  

## 2019-02-07 NOTE — Progress Notes (Signed)
SLEEP MEDICINE CLINIC   Provider:  Melvyn Novas, MD   Primary Care Physician:  Selinda Flavin, MD   Referring Provider: Dr [name] Mons of [facility].    Chief Complaint  Patient presents with  . New Patient (Initial Visit)    pt with daughter in law. had a fall and hit head causing a bleed on brain. he followed with neurosurgery. they have since stated that is resolved and since then his memory has worsened.     HPI: This is a new visit with a different primary complaint on 07 February 2019, at the pleasure of meeting with Baltazar Najjar who goes by [name] and who is meanwhile 79 year old he is seen here in the presence of his daughter-in-law. He was referred by Alveda Reasons. Stargardt who saw the patient on 20 January he describes a gentleman with a history of thrombocytopenia of unknown cause who had suffered multiple falls and memory loss.  An MRI had followed a fall in August 2020 which seems to have been due to some mechanical the area.  Since that fall he has felt significantly more memory difficulties cognitive decline and he continues to to take aspirin daily.  The presented with subdural hematoma, which is now in the state of resorption.  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  He continues to take aspirin in spite of having rather low platelet count in occasion.  These are followed by his primary care physician.  However, he discontinued the aspirin after the visit and after the MRI revealed a subdural hematoma.   Dr. Lonzo Cloud then referred to neurology for memory loss work-up. The patient has not seen Dr Dimas Aguas fr any baseline memory testing, evaluation of metabolic factors, medication side effects.   I would like to add that Mr. Kuennen has not yet had the first shot of his Covid vaccine.  He has been an established CPAP patient in our practice for  over 1 year and he has been using an auto titration CPAP with a pressure from 5 through 17 cmH2O and 3 cm EPR, residual AHI is now 3.4/h which speaks for very good resolution.  He does have some moderate air leakage the 95th percentile pressure is 14.7 cmH2O he does not have central apneas arising the average use at time is 5 hours 33 minutes he has used the machine 29/30 days or 97% and for over 4 hours on 23 days equal to 77% compliance.  Since this visit was for memory loss evaluation a Mini-Mental status was obtained at 21 out of 30 points today.    [name]  " [name] " is a 79 y.o. male patient and seen in a RV on 12-23-2017, in the presence of his wife.  Mr. Vanessen underwent a home sleep test on 20 September 2017 and was diagnosed with moderate severe sleep apnea at an AHI of 21.7/h with only slightly elevated RDI.  His oxygen nadir was 65% SPO2 and was accentuated during REM sleep.  The average heart rate was between 45 and 86 bpm his apnea was mostly obstructive in nature but there were about 20% central apneas.  He slept in a recliner during the home sleep test.  Based on this I ordered an auto titration between 5 and 15 cmH2O 3 cm EPR and a mask of the patient's comfort and fit.  He has been using this device  at home 26 out of the last 30 days, and 423 of those days over 4 hours consecutively the average user time is 5 hours 26 minutes his AHI is 6.0/h.  There are still 1.3 unknown events and this residual AHI.  The residual seem to be mostly obstructive apneas.  The 95th percentile pressure was 13.4 but he does have significant air leaks.  He had about 1% of his user time spent with Cheyne-Stokes respirations, so-called central apneas. He has a sore mouth , dry and parched in the morning- and oral air leaks. [facility] to see if we find a better fit for him, and I will increase the auto CPAP pressure.     The patient was seen here on 09-15-2017  in a referral  via Dr. Dimas AguasHoward for an evaluation of sleep apnea. Mr. Myles RosenthalWilliam David Hollan is a 79 year old Caucasian right-handed gentleman, seen here in the presence of his wife.  He presented to Dr. Dimas AguasHoward  with symptoms of sleep apnea and upper airway obstruction which has been described as chronically present.  His visit with Dr. Dimas AguasHoward took place on 03 September 2017 at 2 PM, at the time he had a regular respiratory rate of 16, heart rate of 76 regular, blood pressure 108/62, height 5 foot 8 inches and weight at the time 156 pounds.  He also is snoring loudly according to his wife, who is a CPAP user, sleeping with his mouth dropped open. Marland Kitchen.  He received an overnight oximetry watch pat which documented repeated short hypoxemia periods.He then based on this pattern estimated AHI of 30.6, total sleep time was estimated to be 6 hours and 5 minutes, with only 6.6 minutes of total desaturation time below 89% SPO2.  He did have bradycardia at night his lowest heart rate was 36, maximum heart rate 87.  This also supports the suspicion of obstructive sleep apnea to be present.  He has been evaluated for unwanted Weight loss over a 6-8 monhth, following carpal tunnel, 2 knee surgeries, 2 shoulder operations.  He stated he lost his appetite, a chest CT ordered by Dr. Dimas AguasHoward was negative for Tumor, he has COPD, has dysphonia.  Chief complaint according to patient :" he has no compliant about his sleep " he doesn't nap in daytime.   Sleep habits are as follows: dinner is at 7 PM, and the patient watches TV after that, until 9.30 PM when he goes to bed. He will be asleep there until 11 PM-  And returns to the den-  There he stays asleep all night - reports not waking up at all, sleeping in he recliner in the den  - not in bed.  Helps his breathing and shoulder pain. While the Bedroom is cool, quiet and dark. He feels it is too cold. He returns to the den and finally sleeps in [employer] den- with the TV on. He has one nocturia at 3 AM - and  still repeats he  sleeps through the night. His wife reports him waking at 5 AM.  Averages only 5-6 hours of sleep. Naps in daytime- if he sits in a recliner - his wife finds him asleep when returning form work at 5.30 PM.   Social history: married Thomasene RippleVeteran- lost one daughter at age 533 to spina bifida.Former smoker, still drinks beers, sometimes liquor and sometimes wine. 2 a day. Caffeine - coffee in AM- no other caffeine sources.   Review of Systems: Out of a  complete 14 system review, the patient complains of only the following symptoms, and all other reviewed systems are negative. hearing loss, dysphonia, joint pain, tinnitus.   Subdural hematoma, memory loss.   How likely are you to doze in the following situations: 0 = not likely, 1 = slight chance, 2 = moderate chance, 3 = high chance  Sitting and Reading? Watching Television? Sitting inactive in a public place (theater or meeting)? Lying down in the afternoon when circumstances permit? Sitting and talking to someone? Sitting quietly after lunch without alcohol? In a car, while stopped for a few minutes in traffic? As a passenger in a car for an hour without a break?  Total = 11/ 24 , FSS at 39/63 points.    His pres CPAP was : Epworth Sleepiness score 16/ 24  , Fatigue severity score 48/ 63  ,  depression score 4/ 15 points.    Social History   Socioeconomic History  . Marital status: Married    Spouse name: Not on file  . Number of children: Not on file  . Years of education: Not on file  . Highest education level: Not on file  Occupational History  . Not on file  Tobacco Use  . Smoking status: Former Smoker    Packs/day: 1.00    Years: 50.00    Pack years: 50.00    Types: Cigarettes    Quit date: 01/13/2011    Years since quitting: 8.0  . Smokeless tobacco: Never Used  Substance and Sexual Activity  . Alcohol use: Yes    Alcohol/week: 14.0 standard drinks    Types: 14 Cans of beer per week  . Drug use: No    . Sexual activity: Not Currently    Birth control/protection: None  Other Topics Concern  . Not on file  Social History Narrative  . Not on file   Social Determinants of Health   Financial Resource Strain:   . Difficulty of Paying Living Expenses: Not on file  Food Insecurity:   . Worried About Programme researcher, broadcasting/film/video in the Last Year: Not on file  . Ran Out of Food in the Last Year: Not on file  Transportation Needs:   . Lack of Transportation (Medical): Not on file  . Lack of Transportation (Non-Medical): Not on file  Physical Activity:   . Days of Exercise per Week: Not on file  . Minutes of Exercise per Session: Not on file  Stress:   . Feeling of Stress : Not on file  Social Connections:   . Frequency of Communication with Friends and Family: Not on file  . Frequency of Social Gatherings with Friends and Family: Not on file  . Attends Religious Services: Not on file  . Active Member of Clubs or Organizations: Not on file  . Attends Banker Meetings: Not on file  . Marital Status: Not on file  Intimate Partner Violence:   . Fear of Current or Ex-Partner: Not on file  . Emotionally Abused: Not on file  . Physically Abused: Not on file  . Sexually Abused: Not on file    Family History  Problem Relation Age of Onset  . Diabetes Mother   . Heart attack Mother   . Heart attack Father     Past Medical History:  Diagnosis Date  . Aortic valve sclerosis   . Arthritis   . COPD (chronic obstructive pulmonary disease) (HCC)   . Diastolic dysfunction   . Hyperlipidemia   .  Hypertension   . Kidney stone   . Sleep apnea     Past Surgical History:  Procedure Laterality Date  . CARPAL TUNNEL RELEASE Bilateral   . CATARACT EXTRACTION Bilateral   . COLONOSCOPY W/ BIOPSIES AND POLYPECTOMY    . ESOPHAGEAL DILATION N/A 01/07/2018   Procedure: ESOPHAGEAL DILATION;  Surgeon: Rogene Houston, MD;  [facility];  Service: Endoscopy;  Laterality: N/A;  .  ESOPHAGOGASTRODUODENOSCOPY (EGD) WITH PROPOFOL N/A 01/07/2018   Procedure: ESOPHAGOGASTRODUODENOSCOPY (EGD) WITH PROPOFOL;  Surgeon: Rogene Houston, MD;  [facility];  Service: Endoscopy;  Laterality: N/A;  10:15  . HAMMER TOE SURGERY    . JOINT REPLACEMENT    . KNEE ARTHROSCOPY W/ MENISCAL REPAIR     right   . MULTIPLE TOOTH EXTRACTIONS    . NM MYOCAR PERF WALL MOTION  11/14/2008   Normal  . REPLACEMENT TOTAL KNEE  1994   left  . ROTATOR CUFF REPAIR Bilateral approx 20 years   [place], [place]   . TOTAL KNEE ARTHROPLASTY Right 02/08/2015   Procedure: TOTAL KNEE ARTHROPLASTY;  Surgeon: Earlie Server, MD;  Location: [facility];  Service: Orthopedics;  Laterality: Right;  . US ECHOCARDIOGRAPHY  01/23/10   AOV appears sclerotic, borderline LA enlargement    Current Outpatient Medications  Medication Sig Dispense Refill  . beclomethasone (QVAR) 40 MCG/ACT inhaler Inhale 2 puffs into the lungs daily.     Marland Kitchen docusate sodium (COLACE) 50 MG capsule Take 50 mg by mouth daily as needed for mild constipation.    . fluticasone (FLONASE) 50 MCG/ACT nasal spray Place 2 sprays into both nostrils every morning.    Marland Kitchen ketoconazole (NIZORAL) 2 % shampoo Apply 1 application topically every other day.     . losartan (COZAAR) 50 MG tablet Take 50 mg by mouth at bedtime.     . Multiple Vitamins-Minerals (MULTIVITAMIN WITH MINERALS) tablet Take 1 tablet by mouth at bedtime.     . naproxen sodium (ALEVE) 220 MG tablet Take 440 mg by mouth at bedtime as needed (pain).    . simvastatin (ZOCOR) 40 MG tablet Take 40 mg by mouth at bedtime.     . tamsulosin (FLOMAX) 0.4 MG CAPS capsule Take 0.4 mg by mouth at bedtime.      No current facility-administered medications for this visit.    Allergies as of 02/07/2019  . (No Known Allergies)    Vitals: BP 134/74   Pulse 75   Temp 97.9 F (36.6 C)   Ht 5\' 10"  (1.778 m)   Wt 163 lb (73.9 kg)   BMI 23.39 kg/m  Last Weight:  Wt Readings from Last 1  Encounters:  02/07/19 163 lb (73.9 kg)   EPP:IRJJ mass index is 23.39 kg/m.     Last Height:   Ht Readings from Last 1 Encounters:  02/07/19 5\' 10"  (1.778 m)    Physical exam:  General: The patient is awake, alert and appears not in acute distress. The patient is defensive and hard of hearing- not happy to be here.  Head: Normocephalic, atraumatic. Neck is supple. Mallampati 2  ,  neck circumference:15. 5. Nasal airflow congested, Retrognathia is seen.  Cardiovascular:  Regular rate and rhythm, without  murmurs or carotid bruit, and without distended neck veins. Respiratory: Lungs are clear to auscultation. Skin:  Without evidence of edema, or rash Trunk: BMI is 22.5 kg/m2 . The patient's posture is stooped.   Neurologic exam : The patient is awake and alert,  oriented to place and time.   Speech is raspy,  Dysphonia. This has worsened, he is very hoarse.  Mood and affect -   Cranial nerves: Pupils are equal and briskly reactive to light. Funduscopic exam - status pots cataract -without evidence of pallor or edema. Extraocular movements  in vertical and horizontal planes intact and without nystagmus. Visual fields by finger perimetry are intact. Hearing to finger rub impaired/  Facial sensation intact to fine touch. Facial motor strength is symmetric and tongue and uvula move midline.   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  He has a droopy left shoulder,lacks arm-swing on that side.  Sensory:  Fine touch, pinprick and vibration were impaired, he has lost vibratory sense at I either ankle, and sensation was reduced at the knee  He had carpal tunnel.  Coordination: Rapid alternating movements in the fingers/hands was normal. Finger-to-nose maneuver  normal without evidence of ataxia, dysmetria or tremor.  Gait and station: Patient walks without assistive device, turns with 5-6 steps, slightly stooped, left shoulder  lower, left arm swing affected.   Deep tendon reflexes: in the upper and lower extremities are symmetric and intact.   Assessment:  After physical and neurologic examination, review of laboratory studies,  Personal review of imaging studies, reports of other /reports of MRI brain Imaging studies, results of polysomnography and / or CPAP compliance- neurophysiology testing and pre-existing records as far as provided in visit., my assessment is   1) OSA- well treated and tolerated. Compliant user- Has OSA with COPD - former smoker.  Improved daytime sleepiness.  2)  Acute memory loss, accelerated after a fall with subdural hematoma. This certainly is a enough to start an accelerated memory loss, he fell on his occiput. He also reports vivid dreams at night.  3)  Gait impairment from multiple orthopedic conditions, high fall risk on uneven ground or incline.  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  NP with vibration loss .  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 Vivid dreams reported.    The patient was advised of the nature of the diagnosed disorder , the treatment options and the  risks for general health and wellness arising from not treating the condition.   I spent more than 40 minutes of  time with the patient and in preparation .  Greater than 50% of time was spent in counseling and coordination of care. We have discussed the diagnosis and differential and I answered the patient's questions.    Plan:  Treatment plan and additional workup :   I will follow through our NP with serial MMSE tests , q 6 month.  I have had anecdotal success with Aricept , I will order starting dose at  5 mg daily for 30 days, followed, if well tolerated , by 10 mg daily.  In order to avoid more vivid dreams I asked to take it after breakfast , daily.   Rv with NP in 4-6 month.    Melvyn Novas, MD 02/07/2019, 1:52 PM  Certified in Neurology by  ABPN Certified in Sleep Medicine by [facility] [address] [place], [place] [address]

## 2019-02-10 DIAGNOSIS — I10 Essential (primary) hypertension: Secondary | ICD-10-CM | POA: Diagnosis not present

## 2019-02-10 DIAGNOSIS — J440 Chronic obstructive pulmonary disease with acute lower respiratory infection: Secondary | ICD-10-CM | POA: Diagnosis not present

## 2019-03-03 DIAGNOSIS — E538 Deficiency of other specified B group vitamins: Secondary | ICD-10-CM | POA: Diagnosis not present

## 2019-03-10 DIAGNOSIS — I10 Essential (primary) hypertension: Secondary | ICD-10-CM | POA: Diagnosis not present

## 2019-03-13 NOTE — Progress Notes (Signed)
This is not correctly named- this is a result note for MRI brain , stating that a right subacute frontal hematoma ( SDH) is seen. It is not the MRI .

## 2019-03-30 ENCOUNTER — Ambulatory Visit: Payer: Self-pay | Admitting: Adult Health

## 2019-03-31 DIAGNOSIS — E538 Deficiency of other specified B group vitamins: Secondary | ICD-10-CM | POA: Diagnosis not present

## 2019-04-03 ENCOUNTER — Telehealth: Payer: Self-pay

## 2019-04-03 ENCOUNTER — Ambulatory Visit: Payer: Medicare Other | Admitting: Adult Health

## 2019-04-03 ENCOUNTER — Other Ambulatory Visit: Payer: Self-pay

## 2019-04-03 VITALS — BP 127/70 | HR 66 | Temp 97.0°F | Ht 70.0 in | Wt 161.6 lb

## 2019-04-03 DIAGNOSIS — G4733 Obstructive sleep apnea (adult) (pediatric): Secondary | ICD-10-CM | POA: Diagnosis not present

## 2019-04-03 DIAGNOSIS — R4189 Other symptoms and signs involving cognitive functions and awareness: Secondary | ICD-10-CM

## 2019-04-03 DIAGNOSIS — Z9989 Dependence on other enabling machines and devices: Secondary | ICD-10-CM

## 2019-04-03 NOTE — Progress Notes (Signed)
PATIENT: Mark Solomon DOB: 1940/07/24  REASON FOR VISIT: follow up HISTORY FROM: patient  HISTORY OF PRESENT ILLNESS: Today 04/03/19:  Mark Solomon is a 79 year old male with a history of obstructive sleep apnea on CPAP.  He returns today for follow-up.  His download indicates that he uses machine 26 out of 30 days for compliance of 87%.  He uses machine greater than 4 hours 17 days for compliance of 57%.  On average he uses his machine 5 hours and 31 minutes.  His residual AHI is 11.4 on 5 to 17 cm of water with EPR 3.  Leak in the 95th percentile is 58.3 L/min.  He states that he has never changed out his supplies.  He does feel air blowing on his face every night.  The patient was also seen in January after suffering a subdural hematoma.  He reported cognitive issues.  He is currently on Aricept started by Dr. Brett Fairy.  He states that he has experienced some diarrhea.  This is not continuous.  He is taking 10 mg currently.  HISTORY (Copied from Dr.Dohmeier's note):  This is a new visit with a different primary complaint on 07 February 2019, at the pleasure of meeting with Mark Solomon who goes by Mark Solomon and who is meanwhile 79 year old he is seen here in the presence of his daughter-in-law. He was referred by Dolores Frame. Stargardt who saw the patient on 20 January he describes a gentleman with a history of thrombocytopenia of unknown cause who had suffered multiple falls and memory loss.  An MRI had followed a fall in August 2020 which seems to have been due to some mechanical the area.  Since that fall he has felt significantly more memory difficulties cognitive decline and he continues to to take aspirin daily.  The presented with subdural hematoma, which is now in the state of resorption.  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  He continues to take aspirin in spite of having  rather low platelet count in occasion.  These are followed by his primary care physician.  However, he discontinued the aspirin after the visit and after the MRI revealed a subdural hematoma.   Dr. Jerolyn Center then referred to neurology for memory loss work-up. The patient has not seen Dr Nadara Mustard fr any baseline memory testing, evaluation of metabolic factors, medication side effects.   I would like to add that Mark Solomon has not yet had the first shot of his Covid vaccine.  He has been an established CPAP patient in our practice for over 1 year and he has been using an auto titration CPAP with a pressure from 5 through 17 cmH2O and 3 cm EPR, residual AHI is now 3.4/h which speaks for very good resolution.  He does have some moderate air leakage the 95th percentile pressure is 14.7 cmH2O he does not have central apneas arising the average use at time is 5 hours 33 minutes he has used the machine 29/30 days or 97% and for over 4 hours on 23 days equal to 77% compliance.  Since this visit was for memory loss evaluation a Mini-Mental status was obtained at 21 out of 30 points today.    Nonnie Done  " Mark Solomon " is a 79 y.o. male patient and seen in a RV on 12-23-2017, in the presence of his wife.  Mark Solomon underwent a home sleep test  on 20 September 2017 and was diagnosed with moderate severe sleep apnea at an AHI of 21.7/h with only slightly elevated RDI.  His oxygen nadir was 65% SPO2 and was accentuated during REM sleep.  The average heart rate was between 45 and 86 bpm his apnea was mostly obstructive in nature but there were about 20% central apneas.  He slept in a recliner during the home sleep test.  Based on this I ordered an auto titration between 5 and 15 cmH2O 3 cm EPR and a mask of the patient's comfort and fit.  He has been using this device at home 26 out of the last 30 days, and 423 of those days over 4 hours consecutively the average user time is 5 hours 26 minutes his AHI is 6.0/h.  There  are still 1.3 unknown events and this residual AHI.  The residual seem to be mostly obstructive apneas.  The 95th percentile pressure was 13.4 but he does have significant air leaks.  He had about 1% of his user time spent with Cheyne-Stokes respirations, so-called central apneas. He has a sore mouth , dry and parched in the morning- and oral air leaks. He will meet with [facility], [facility] today to see if we find a better fit for him, and I will increase the auto CPAP pressure.     The patient was seen here on 09-15-2017  in a referral via Dr. Dimas Aguas for an evaluation of sleep apnea. Mark Solomon is a 79 year old Caucasian right-handed gentleman, seen here in the presence of his wife.  He presented to Dr. Dimas Aguas  with symptoms of sleep apnea and upper airway obstruction which has been described as chronically present.  His visit with Dr. Dimas Aguas took place on 03 September 2017 at 2 PM, at the time he had a regular respiratory rate of 16, heart rate of 76 regular, blood pressure 108/62, height 5 foot 8 inches and weight at the time 156 pounds.  He also is snoring loudly according to his wife, who is a CPAP user, sleeping with his mouth dropped open. Marland Kitchen  He received an overnight oximetry watch pat which documented repeated short hypoxemia periods.He then based on this pattern estimated AHI of 30.6, total sleep time was estimated to be 6 hours and 5 minutes, with only 6.6 minutes of total desaturation time below 89% SPO2.  He did have bradycardia at night his lowest heart rate was 36, maximum heart rate 87.  This also supports the suspicion of obstructive sleep apnea to be present.  He has been evaluated for unwanted Weight loss over a 6-8 monhth, following carpal tunnel, 2 knee surgeries, 2 shoulder operations.  He stated he lost his appetite, a chest CT ordered by Dr. Dimas Aguas was negative for Tumor, he has COPD, has dysphonia.  Chief complaint according to patient :" he has no compliant about his  sleep " he doesn't nap in daytime.   Sleep habits are as follows: dinner is at 7 PM, and the patient watches TV after that, until 9.30 PM when he goes to bed. He will be asleep there until 11 PM-  And returns to the den-  There he stays asleep all night - reports not waking up at all, sleeping in he recliner in the den  - not in bed.  Helps his breathing and shoulder pain. While the Bedroom is cool, quiet and dark. He feels it is too cold. He returns to the den and finally sleeps in  the den- with the TV on. He has one nocturia at 3 AM - and still repeats he  sleeps through the night. His wife reports him waking at 5 AM.  Averages only 5-6 hours of sleep. Naps in daytime- if he sits in a recliner - his wife finds him asleep when returning form work at 5.30 PM.   Social history: married Thomasene Ripple- lost one daughter at age 38 to spina bifida.Former smoker, still drinks beers, sometimes liquor and sometimes wine. 2 a day. Caffeine - coffee in AM- no other caffeine sources.    REVIEW OF SYSTEMS: Out of a complete 14 system review of symptoms, the patient complains only of the following symptoms, and all other reviewed systems are negative.  Epworth sleepiness score 10 Fatigue severity score 43    ALLERGIES: No Known Allergies  HOME MEDICATIONS: Outpatient Medications Prior to Visit  Medication Sig Dispense Refill  . beclomethasone (QVAR) 40 MCG/ACT inhaler Inhale 2 puffs into the lungs daily.     Marland Kitchen docusate sodium (COLACE) 50 MG capsule Take 50 mg by mouth daily as needed for mild constipation.    Marland Kitchen donepezil (ARICEPT) 10 MG tablet 10 mg after breakfast po - with water. 30 tablet 5  . donepezil (ARICEPT) 5 MG tablet Take after breakfast PO, 30 days of 30 mg followed by 10 mg after 30 days. 30 tablet 0  . fluticasone (FLONASE) 50 MCG/ACT nasal spray Place 2 sprays into both nostrils every morning.    Marland Kitchen ketoconazole (NIZORAL) 2 % shampoo Apply 1 application topically every other day.     .  losartan (COZAAR) 50 MG tablet Take 50 mg by mouth at bedtime.     . Multiple Vitamins-Minerals (MULTIVITAMIN WITH MINERALS) tablet Take 1 tablet by mouth at bedtime.     . naproxen sodium (ALEVE) 220 MG tablet Take 440 mg by mouth at bedtime as needed (pain).    . simvastatin (ZOCOR) 40 MG tablet Take 40 mg by mouth at bedtime.     . tamsulosin (FLOMAX) 0.4 MG CAPS capsule Take 0.4 mg by mouth at bedtime.      No facility-administered medications prior to visit.    PAST MEDICAL HISTORY: Past Medical History:  Diagnosis Date  . Aortic valve sclerosis   . Arthritis   . COPD (chronic obstructive pulmonary disease) (HCC)   . Diastolic dysfunction   . Hyperlipidemia   . Hypertension   . Kidney stone   . Sleep apnea     PAST SURGICAL HISTORY: Past Surgical History:  Procedure Laterality Date  . CARPAL TUNNEL RELEASE Bilateral   . CATARACT EXTRACTION Bilateral   . COLONOSCOPY W/ BIOPSIES AND POLYPECTOMY    . ESOPHAGEAL DILATION N/A 01/07/2018   Procedure: ESOPHAGEAL DILATION;  Surgeon: Malissa Hippo, MD;  [facility];  Service: Endoscopy;  Laterality: N/A;  . ESOPHAGOGASTRODUODENOSCOPY (EGD) WITH PROPOFOL N/A 01/07/2018   Procedure: ESOPHAGOGASTRODUODENOSCOPY (EGD) WITH PROPOFOL;  Surgeon: Malissa Hippo, MD;  Location: [facility];  Service: Endoscopy;  Laterality: N/A;  10:15  . HAMMER TOE SURGERY    . JOINT REPLACEMENT    . KNEE ARTHROSCOPY W/ MENISCAL REPAIR     right   . MULTIPLE TOOTH EXTRACTIONS    . NM MYOCAR PERF WALL MOTION  11/14/2008   Normal  . REPLACEMENT TOTAL KNEE  1994   left  . ROTATOR CUFF REPAIR Bilateral approx 20 years   [place], [place]   . TOTAL KNEE ARTHROPLASTY Right 02/08/2015  Procedure: TOTAL KNEE ARTHROPLASTY;  Surgeon: Frederico Hamman, MD;  Location: [facility] OR;  Service: Orthopedics;  Laterality: Right;  . US ECHOCARDIOGRAPHY  01/23/10   AOV appears sclerotic, borderline LA enlargement    FAMILY HISTORY: Family History  Problem  Relation Age of Onset  . Diabetes Mother   . Heart attack Mother   . Heart attack Father     SOCIAL HISTORY: Social History   Socioeconomic History  . Marital status: Married    Spouse name: Not on file  . Number of children: Not on file  . Years of education: Not on file  . Highest education level: Not on file  Occupational History  . Not on file  Tobacco Use  . Smoking status: Former Smoker    Packs/day: 1.00    Years: 50.00    Pack years: 50.00    Types: Cigarettes    Quit date: 01/13/2011    Years since quitting: 8.2  . Smokeless tobacco: Never Used  Substance and Sexual Activity  . Alcohol use: Yes    Alcohol/week: 14.0 standard drinks    Types: 14 Cans of beer per week  . Drug use: No  . Sexual activity: Not Currently    Birth control/protection: None  Other Topics Concern  . Not on file  Social History Narrative  . Not on file   Social Determinants of Health   Financial Resource Strain:   . Difficulty of Paying Living Expenses:   Food Insecurity:   . Worried About Programme researcher, broadcasting/film/video in the Last Year:   . Barista in the Last Year:   Transportation Needs:   . Freight forwarder (Medical):   Marland Kitchen Lack of Transportation (Non-Medical):   Physical Activity:   . Days of Exercise per Week:   . Minutes of Exercise per Session:   Stress:   . Feeling of Stress :   Social Connections:   . Frequency of Communication with Friends and Family:   . Frequency of Social Gatherings with Friends and Family:   . Attends Religious Services:   . Active Member of Clubs or Organizations:   . Attends Banker Meetings:   Marland Kitchen Marital Status:   Intimate Partner Violence:   . Fear of Current or Ex-Partner:   . Emotionally Abused:   Marland Kitchen Physically Abused:   . Sexually Abused:       PHYSICAL EXAM  Vitals:   04/03/19 0830  BP: 127/70  Pulse: 66  Temp: (!) 97 F (36.1 C)  Weight: 161 lb 9.6 oz (73.3 kg)  Height: 5\' 10"  (1.778 m)   Body mass index  is 23.19 kg/m.  Generalized: Well developed, in no acute distress   Neurological examination  Mentation: Alert oriented to time, place, history taking. Follows all commands speech and language fluent Cranial nerve II-XII: Pupils were equal round reactive to light. Extraocular movements were full, visual field were full on confrontational test. Facial sensation and strength were normal. Uvula tongue midline. Head turning and shoulder shrug  were normal and symmetric. Motor: The motor testing reveals 5 over 5 strength of all 4 extremities. Good symmetric motor tone is noted throughout.  Sensory: Sensory testing is intact to soft touch on all 4 extremities. No evidence of extinction is noted.  Coordination: Cerebellar testing reveals good finger-nose-finger and heel-to-shin bilaterally.  Gait and station: Gait is normal. Tandem gait is normal. Romberg is negative. No drift is seen.  Reflexes: Deep tendon reflexes are symmetric and  normal bilaterally.   DIAGNOSTIC DATA (LABS, IMAGING, TESTING) - I reviewed patient records, labs, notes, testing and imaging myself where available.  Lab Results  Component Value Date   WBC 3.4 (L) 01/03/2018   HGB 13.8 01/03/2018   HCT 42.2 01/03/2018   MCV 99.1 01/03/2018   PLT 89 (L) 01/03/2018      Component Value Date/Time   NA 141 01/03/2018 1004   K 3.4 (L) 01/03/2018 1004   CL 107 01/03/2018 1004   CO2 26 01/03/2018 1004   GLUCOSE 83 01/03/2018 1004   BUN 13 01/03/2018 1004   CREATININE 0.66 01/03/2018 1004   CALCIUM 9.0 01/03/2018 1004   PROT 6.5 01/28/2015 1243   ALBUMIN 4.1 01/28/2015 1243   AST 33 01/28/2015 1243   ALT 24 01/28/2015 1243   ALKPHOS 58 01/28/2015 1243   BILITOT 0.6 01/28/2015 1243   GFRNONAA >60 01/03/2018 1004   GFRAA >60 01/03/2018 1004      ASSESSMENT AND PLAN 79 y.o. year old male  has a past medical history of Aortic valve sclerosis, Arthritis, COPD (chronic obstructive pulmonary disease) (HCC), Diastolic  dysfunction, Hyperlipidemia, Hypertension, Kidney stone, and Sleep apnea. here with:  1.  Obstructive sleep apnea on CPAP  -Suboptimal compliance -Encouraged patient to use CPAP nightly and greater than 4 hours each night -Mask refitting ordered. -Advised patient that he should change out his supplies at least every 3 months  2.  Cognitive disturbance   -Decrease Aricept to 5 mg due to diarrhea if this does not improve medication will need to be discontinued -Retest memory at next visit  Follow-up in 3 months or sooner if needed  I spent 30 minutes of face-to-face and non-face-to-face time with patient.  This included previsit chart review, lab review, study review, order entry, electronic health record documentation, patient education.  Butch PennyMegan Hilma Steinhilber, MSN, NP-C 04/03/2019, 9:35 AM [facility] [address], [address] [place] [address] [phone]

## 2019-04-03 NOTE — Patient Instructions (Signed)
Your Plan:  Continue CPAP Mask refitting and new supplies Decrease Aricept to 5 mg to see if diarrhea subsides If your symptoms worsen or you develop new symptoms please let us know.    Thank you for coming to see [place] at [facility]. I hope we have been able to provide you high quality care today.  You may receive a patient satisfaction survey over the next few weeks. We would appreciate your feedback and comments so that we may continue to improve ourselves and the health of our patients.

## 2019-04-03 NOTE — Telephone Encounter (Signed)
Faxed over orders for CPAP over to Pts pharmacy/DME per NP Kerman Passey request.  [facility].

## 2019-04-06 NOTE — Telephone Encounter (Signed)
Pt's wife called wanting to know the update on the Cpap machine. Please advise.

## 2019-04-06 NOTE — Telephone Encounter (Signed)
Called pt no answer Left a voicemail.   If pt calls back please relay: The pharmacy/DME company has the CPAP order. He need to follow up with [facility] [phone]. But they do in fact have the order.

## 2019-04-07 DIAGNOSIS — G4733 Obstructive sleep apnea (adult) (pediatric): Secondary | ICD-10-CM | POA: Diagnosis not present

## 2019-04-12 DIAGNOSIS — I10 Essential (primary) hypertension: Secondary | ICD-10-CM | POA: Diagnosis not present

## 2019-04-28 DIAGNOSIS — E538 Deficiency of other specified B group vitamins: Secondary | ICD-10-CM | POA: Diagnosis not present

## 2019-05-22 DIAGNOSIS — R5383 Other fatigue: Secondary | ICD-10-CM | POA: Diagnosis not present

## 2019-05-22 DIAGNOSIS — E538 Deficiency of other specified B group vitamins: Secondary | ICD-10-CM | POA: Diagnosis not present

## 2019-05-22 DIAGNOSIS — D696 Thrombocytopenia, unspecified: Secondary | ICD-10-CM | POA: Diagnosis not present

## 2019-05-26 DIAGNOSIS — Z77098 Contact with and (suspected) exposure to other hazardous, chiefly nonmedicinal, chemicals: Secondary | ICD-10-CM | POA: Diagnosis not present

## 2019-05-26 DIAGNOSIS — D696 Thrombocytopenia, unspecified: Secondary | ICD-10-CM | POA: Diagnosis not present

## 2019-05-26 DIAGNOSIS — E538 Deficiency of other specified B group vitamins: Secondary | ICD-10-CM | POA: Diagnosis not present

## 2019-06-21 DIAGNOSIS — E538 Deficiency of other specified B group vitamins: Secondary | ICD-10-CM | POA: Diagnosis not present

## 2019-07-04 DIAGNOSIS — D729 Disorder of white blood cells, unspecified: Secondary | ICD-10-CM | POA: Diagnosis not present

## 2019-07-04 DIAGNOSIS — Z0001 Encounter for general adult medical examination with abnormal findings: Secondary | ICD-10-CM | POA: Diagnosis not present

## 2019-07-04 DIAGNOSIS — I10 Essential (primary) hypertension: Secondary | ICD-10-CM | POA: Diagnosis not present

## 2019-07-04 DIAGNOSIS — R5382 Chronic fatigue, unspecified: Secondary | ICD-10-CM | POA: Diagnosis not present

## 2019-07-04 DIAGNOSIS — D519 Vitamin B12 deficiency anemia, unspecified: Secondary | ICD-10-CM | POA: Diagnosis not present

## 2019-07-06 ENCOUNTER — Other Ambulatory Visit: Payer: Self-pay

## 2019-07-06 ENCOUNTER — Ambulatory Visit: Payer: Medicare Other | Admitting: Adult Health

## 2019-07-06 VITALS — BP 115/58 | HR 68 | Ht 69.0 in | Wt 163.0 lb

## 2019-07-06 DIAGNOSIS — R4189 Other symptoms and signs involving cognitive functions and awareness: Secondary | ICD-10-CM

## 2019-07-06 DIAGNOSIS — Z9989 Dependence on other enabling machines and devices: Secondary | ICD-10-CM | POA: Diagnosis not present

## 2019-07-06 DIAGNOSIS — G4733 Obstructive sleep apnea (adult) (pediatric): Secondary | ICD-10-CM | POA: Diagnosis not present

## 2019-07-06 NOTE — Progress Notes (Signed)
PATIENT: Mark Solomon DOB: February 03, 1940  REASON FOR VISIT: follow up HISTORY FROM: patient  HISTORY OF PRESENT ILLNESS: Today 07/06/19:  Mark Solomon is a 79 year old male with a history of obstructive sleep apnea on CPAP.  Download indicates that he use his machine 28 out of 30 days for compliance of 93%.  He use his machine greater than 4 hours each night.  On average he uses his machine 7 hours and 42 minutes.  His residual AHI is 5.7 on 5 to 17 cm of water with EPR 3.  Leak in the 95th percentile is 89.6 L/min.  Reports that he has not changed out his supplies.  Him and his wife feel that his memory has remained stable.  He lives at home with his wife.  He is able to complete all ADLs independently.  He returns today for an evaluation.  Reports that he does have diarrhea on Aricept  HISTORY Mark Solomon is a 79 year old male with a history of obstructive sleep apnea on CPAP.  He returns today for follow-up.  His download indicates that he uses machine 26 out of 30 days for compliance of 87%.  He uses machine greater than 4 hours 17 days for compliance of 57%.  On average he uses his machine 5 hours and 31 minutes.  His residual AHI is 11.4 on 5 to 17 cm of water with EPR 3.  Leak in the 95th percentile is 58.3 L/min.  He states that he has never changed out his supplies.  He does feel air blowing on his face every night.  The patient was also seen in January after suffering a subdural hematoma.  He reported cognitive issues.  He is currently on Aricept started by Dr. Brett Fairy.  He states that he has experienced some diarrhea.  This is not continuous.  He is taking 10 mg currently.  REVIEW OF SYSTEMS: Out of a complete 14 system review of symptoms, the patient complains only of the following symptoms, and all other reviewed systems are negative.  FSS 41 ESS 10   ALLERGIES: No Known Allergies  HOME MEDICATIONS: Outpatient Medications Prior to Visit  Medication Sig Dispense Refill  .  beclomethasone (QVAR) 40 MCG/ACT inhaler Inhale 2 puffs into the lungs daily.     Marland Kitchen docusate sodium (COLACE) 50 MG capsule Take 50 mg by mouth daily as needed for mild constipation.    Marland Kitchen donepezil (ARICEPT) 10 MG tablet 10 mg after breakfast po - with water. 30 tablet 5  . donepezil (ARICEPT) 5 MG tablet Take after breakfast PO, 30 days of 30 mg followed by 10 mg after 30 days. 30 tablet 0  . fluticasone (FLONASE) 50 MCG/ACT nasal spray Place 2 sprays into both nostrils every morning.    Marland Kitchen ketoconazole (NIZORAL) 2 % shampoo Apply 1 application topically every other day.     . losartan (COZAAR) 50 MG tablet Take 50 mg by mouth at bedtime.     . Multiple Vitamins-Minerals (MULTIVITAMIN WITH MINERALS) tablet Take 1 tablet by mouth at bedtime.     . naproxen sodium (ALEVE) 220 MG tablet Take 440 mg by mouth at bedtime as needed (pain).    . simvastatin (ZOCOR) 40 MG tablet Take 40 mg by mouth at bedtime.     . tamsulosin (FLOMAX) 0.4 MG CAPS capsule Take 0.4 mg by mouth at bedtime.      No facility-administered medications prior to visit.    PAST MEDICAL HISTORY: Past Medical History:  Diagnosis Date  . Aortic valve sclerosis   . Arthritis   . COPD (chronic obstructive pulmonary disease) (HCC)   . Diastolic dysfunction   . Hyperlipidemia   . Hypertension   . Kidney stone   . Sleep apnea     PAST SURGICAL HISTORY: Past Surgical History:  Procedure Laterality Date  . CARPAL TUNNEL RELEASE Bilateral   . CATARACT EXTRACTION Bilateral   . COLONOSCOPY W/ BIOPSIES AND POLYPECTOMY    . ESOPHAGEAL DILATION N/A 01/07/2018   Procedure: ESOPHAGEAL DILATION;  Surgeon: Malissa Hippo, MD;  [facility];  Service: Endoscopy;  Laterality: N/A;  . ESOPHAGOGASTRODUODENOSCOPY (EGD) WITH PROPOFOL N/A 01/07/2018   Procedure: ESOPHAGOGASTRODUODENOSCOPY (EGD) WITH PROPOFOL;  Surgeon: Malissa Hippo, MD;  [facility];  Service: Endoscopy;  Laterality: N/A;  10:15  . HAMMER TOE  SURGERY    . JOINT REPLACEMENT    . KNEE ARTHROSCOPY W/ MENISCAL REPAIR     right   . MULTIPLE TOOTH EXTRACTIONS    . NM MYOCAR PERF WALL MOTION  11/14/2008   Normal  . REPLACEMENT TOTAL KNEE  1994   left  . ROTATOR CUFF REPAIR Bilateral approx 20 years   [name], [name]   . TOTAL KNEE ARTHROPLASTY Right 02/08/2015   Procedure: TOTAL KNEE ARTHROPLASTY;  Surgeon: Frederico Hamman, MD;  Location: [facility] OR;  Service: Orthopedics;  Laterality: Right;  . US ECHOCARDIOGRAPHY  01/23/10   AOV appears sclerotic, borderline LA enlargement    FAMILY HISTORY: Family History  Problem Relation Age of Onset  . Diabetes Mother   . Heart attack Mother   . Heart attack Father     SOCIAL HISTORY: Social History   Socioeconomic History  . Marital status: Married    Spouse name: Not on file  . Number of children: Not on file  . Years of education: Not on file  . Highest education level: Not on file  Occupational History  . Not on file  Tobacco Use  . Smoking status: Former Smoker    Packs/day: 1.00    Years: 50.00    Pack years: 50.00    Types: Cigarettes    Quit date: 01/13/2011    Years since quitting: 8.4  . Smokeless tobacco: Never Used  Vaping Use  . Vaping Use: Never used  Substance and Sexual Activity  . Alcohol use: Yes    Alcohol/week: 14.0 standard drinks    Types: 14 Cans of beer per week  . Drug use: No  . Sexual activity: Not Currently    Birth control/protection: None  Other Topics Concern  . Not on file  Social History Narrative  . Not on file   Social Determinants of Health   Financial Resource Strain:   . Difficulty of Paying Living Expenses:   Food Insecurity:   . Worried About Programme researcher, broadcasting/film/video in the Last Year:   . Barista in the Last Year:   Transportation Needs:   . Freight forwarder (Medical):   Marland Kitchen Lack of Transportation (Non-Medical):   Physical Activity:   . Days of Exercise per Week:   . Minutes of Exercise per Session:   Stress:   .  Feeling of Stress :   Social Connections:   . Frequency of Communication with Friends and Family:   . Frequency of Social Gatherings with Friends and Family:   . Attends Religious Services:   . Active Member of Clubs or Organizations:   . Attends Club  or Organization Meetings:   Marland Kitchen Marital Status:   Intimate Partner Violence:   . Fear of Current or Ex-Partner:   . Emotionally Abused:   Marland Kitchen Physically Abused:   . Sexually Abused:       PHYSICAL EXAM  Vitals:   07/06/19 0756  BP: (!) 115/58  Pulse: 68  Weight: 163 lb (73.9 kg)  Height: 5\' 9"  (1.753 m)   Body mass index is 24.07 kg/m.   MMSE - Mini Mental State Exam 07/06/2019 02/07/2019  Orientation to time 4 3  Orientation to Place 3 3  Registration 3 3  Attention/ Calculation 1 5  Recall 0 0  Language- name 2 objects 2 2  Language- repeat 1 1  Language- follow 3 step command 3 3  Language- read & follow direction 1 1  Write a sentence - 0  Copy design - 0  Total score - 21     Generalized: Well developed, in no acute distress  Chest: Lungs clear to auscultation bilaterally  Neurological examination  Mentation: Alert oriented to time, place, history taking. Follows all commands speech and language fluent Cranial nerve II-XII: Extraocular movements were full, visual field were full on confrontational test Head turning and shoulder shrug  were normal and symmetric. Motor: The motor testing reveals 5 over 5 strength of all 4 extremities. Good symmetric motor tone is noted throughout.  Sensory: Sensory testing is intact to soft touch on all 4 extremities. No evidence of extinction is noted.  Gait and station: Gait is normal.    DIAGNOSTIC DATA (LABS, IMAGING, TESTING) - I reviewed patient records, labs, notes, testing and imaging myself where available.  Lab Results  Component Value Date   WBC 3.4 (L) 01/03/2018   HGB 13.8 01/03/2018   HCT 42.2 01/03/2018   MCV 99.1 01/03/2018   PLT 89 (L) 01/03/2018       Component Value Date/Time   NA 141 01/03/2018 1004   K 3.4 (L) 01/03/2018 1004   CL 107 01/03/2018 1004   CO2 26 01/03/2018 1004   GLUCOSE 83 01/03/2018 1004   BUN 13 01/03/2018 1004   CREATININE 0.66 01/03/2018 1004   CALCIUM 9.0 01/03/2018 1004   PROT 6.5 01/28/2015 1243   ALBUMIN 4.1 01/28/2015 1243   AST 33 01/28/2015 1243   ALT 24 01/28/2015 1243   ALKPHOS 58 01/28/2015 1243   BILITOT 0.6 01/28/2015 1243   GFRNONAA >60 01/03/2018 1004   GFRAA >60 01/03/2018 1004      ASSESSMENT AND PLAN 79 y.o. year old male  has a past medical history of Aortic valve sclerosis, Arthritis, COPD (chronic obstructive pulmonary disease) (HCC), Diastolic dysfunction, Hyperlipidemia, Hypertension, Kidney stone, and Sleep apnea. here with: OSA on CPAP  - CPAP compliance excellent - Good treatment of AHI  -Patient has a high leak.  I have ordered a mask refitting and new supplies - Encourage patient to use CPAP nightly and > 4 hours each night  Memory disturbance  -MMSE slightly lower 18/30 -Diarrhea on Aricept: Advised to stop medication -In 2 weeks if bowels have returned to normal we will start Namenda they were advised to call the office.   F/U in 6 months or sooner if needed   I spent 20 minutes of face-to-face and non-face-to-face time with patient.  This included previsit chart review, lab review, study review, order entry, electronic health record documentation, patient education.  73, MSN, NP-C 07/06/2019, 8:19 AM [facility] [address] [place],  [place] [address] [phone]

## 2019-07-06 NOTE — Patient Instructions (Signed)
Your Plan:  Continue CPAP Stop Aricept Call in 2 weeks regarding medication If your symptoms worsen or you develop new symptoms please let us know.   Thank you for coming to see [place] at [facility]. I hope we have been able to provide you high quality care today.  You may receive a patient satisfaction survey over the next few weeks. We would appreciate your feedback and comments so that we may continue to improve ourselves and the health of our patients.

## 2019-07-11 DIAGNOSIS — Z6824 Body mass index (BMI) 24.0-24.9, adult: Secondary | ICD-10-CM | POA: Diagnosis not present

## 2019-07-11 DIAGNOSIS — I10 Essential (primary) hypertension: Secondary | ICD-10-CM | POA: Diagnosis not present

## 2019-07-11 DIAGNOSIS — R634 Abnormal weight loss: Secondary | ICD-10-CM | POA: Diagnosis not present

## 2019-07-11 DIAGNOSIS — D729 Disorder of white blood cells, unspecified: Secondary | ICD-10-CM | POA: Diagnosis not present

## 2019-07-11 DIAGNOSIS — Z0001 Encounter for general adult medical examination with abnormal findings: Secondary | ICD-10-CM | POA: Diagnosis not present

## 2019-07-12 DIAGNOSIS — J440 Chronic obstructive pulmonary disease with acute lower respiratory infection: Secondary | ICD-10-CM | POA: Diagnosis not present

## 2019-07-12 DIAGNOSIS — Z87891 Personal history of nicotine dependence: Secondary | ICD-10-CM | POA: Diagnosis not present

## 2019-07-12 DIAGNOSIS — I10 Essential (primary) hypertension: Secondary | ICD-10-CM | POA: Diagnosis not present

## 2019-07-12 DIAGNOSIS — D519 Vitamin B12 deficiency anemia, unspecified: Secondary | ICD-10-CM | POA: Diagnosis not present

## 2019-07-19 DIAGNOSIS — E538 Deficiency of other specified B group vitamins: Secondary | ICD-10-CM | POA: Diagnosis not present

## 2019-07-20 ENCOUNTER — Telehealth: Payer: Self-pay | Admitting: Adult Health

## 2019-07-20 MED ORDER — MEMANTINE HCL 5 MG PO TABS: 5.0000 mg | ORAL_TABLET | Freq: Two times a day (BID) | ORAL | 5 refills | Status: DC

## 2019-07-20 NOTE — Telephone Encounter (Signed)
Phone rep checked office voicemail's, at 11:08 wife left message stating the diarrhea is no longer an issue and they would like something other than Aricept called into [facility]. Please call

## 2019-07-20 NOTE — Telephone Encounter (Signed)
Namenda 5 mg twice a day ordered for the patient please advise patient that if he is tolerating the medication well within 1 month they can call and I will increase dose

## 2019-07-20 NOTE — Addendum Note (Signed)
Addended by: Enedina Finner on: 07/20/2019 03:18 PM   Modules accepted: Orders

## 2019-07-20 NOTE — Telephone Encounter (Signed)
Wife called and asked for details of VM. I reviewed VM with her. She verbalized understanding, appreciation.

## 2019-07-20 NOTE — Telephone Encounter (Signed)
Called wife and LVM informing her of new Rx and NP's instructions. Left # for questions.

## 2019-08-11 DIAGNOSIS — Z87891 Personal history of nicotine dependence: Secondary | ICD-10-CM | POA: Diagnosis not present

## 2019-08-11 DIAGNOSIS — J440 Chronic obstructive pulmonary disease with acute lower respiratory infection: Secondary | ICD-10-CM | POA: Diagnosis not present

## 2019-08-11 DIAGNOSIS — I10 Essential (primary) hypertension: Secondary | ICD-10-CM | POA: Diagnosis not present

## 2019-08-11 DIAGNOSIS — D519 Vitamin B12 deficiency anemia, unspecified: Secondary | ICD-10-CM | POA: Diagnosis not present

## 2019-08-15 ENCOUNTER — Other Ambulatory Visit: Payer: Self-pay | Admitting: Neurology

## 2019-08-16 DIAGNOSIS — E538 Deficiency of other specified B group vitamins: Secondary | ICD-10-CM | POA: Diagnosis not present

## 2019-08-17 DIAGNOSIS — H26493 Other secondary cataract, bilateral: Secondary | ICD-10-CM | POA: Diagnosis not present

## 2019-08-17 DIAGNOSIS — H52223 Regular astigmatism, bilateral: Secondary | ICD-10-CM | POA: Diagnosis not present

## 2019-09-12 DIAGNOSIS — D519 Vitamin B12 deficiency anemia, unspecified: Secondary | ICD-10-CM | POA: Diagnosis not present

## 2019-09-12 DIAGNOSIS — Z87891 Personal history of nicotine dependence: Secondary | ICD-10-CM | POA: Diagnosis not present

## 2019-09-12 DIAGNOSIS — J440 Chronic obstructive pulmonary disease with acute lower respiratory infection: Secondary | ICD-10-CM | POA: Diagnosis not present

## 2019-09-12 DIAGNOSIS — I10 Essential (primary) hypertension: Secondary | ICD-10-CM | POA: Diagnosis not present

## 2019-09-15 DIAGNOSIS — E538 Deficiency of other specified B group vitamins: Secondary | ICD-10-CM | POA: Diagnosis not present

## 2019-09-26 DIAGNOSIS — D72819 Decreased white blood cell count, unspecified: Secondary | ICD-10-CM | POA: Diagnosis not present

## 2019-09-26 DIAGNOSIS — E538 Deficiency of other specified B group vitamins: Secondary | ICD-10-CM | POA: Diagnosis not present

## 2019-09-26 DIAGNOSIS — D696 Thrombocytopenia, unspecified: Secondary | ICD-10-CM | POA: Diagnosis not present

## 2019-09-26 DIAGNOSIS — R5383 Other fatigue: Secondary | ICD-10-CM | POA: Diagnosis not present

## 2019-09-28 DIAGNOSIS — E538 Deficiency of other specified B group vitamins: Secondary | ICD-10-CM | POA: Diagnosis not present

## 2019-09-28 DIAGNOSIS — R5383 Other fatigue: Secondary | ICD-10-CM | POA: Diagnosis not present

## 2019-09-28 DIAGNOSIS — D72819 Decreased white blood cell count, unspecified: Secondary | ICD-10-CM | POA: Diagnosis not present

## 2019-09-28 DIAGNOSIS — D696 Thrombocytopenia, unspecified: Secondary | ICD-10-CM | POA: Diagnosis not present

## 2019-10-06 DIAGNOSIS — H26491 Other secondary cataract, right eye: Secondary | ICD-10-CM | POA: Diagnosis not present

## 2019-10-12 DIAGNOSIS — I10 Essential (primary) hypertension: Secondary | ICD-10-CM | POA: Diagnosis not present

## 2019-10-12 DIAGNOSIS — Z87891 Personal history of nicotine dependence: Secondary | ICD-10-CM | POA: Diagnosis not present

## 2019-10-12 DIAGNOSIS — D519 Vitamin B12 deficiency anemia, unspecified: Secondary | ICD-10-CM | POA: Diagnosis not present

## 2019-10-12 DIAGNOSIS — J440 Chronic obstructive pulmonary disease with acute lower respiratory infection: Secondary | ICD-10-CM | POA: Diagnosis not present

## 2019-10-13 DIAGNOSIS — F17200 Nicotine dependence, unspecified, uncomplicated: Secondary | ICD-10-CM | POA: Diagnosis not present

## 2019-10-13 DIAGNOSIS — Z79899 Other long term (current) drug therapy: Secondary | ICD-10-CM | POA: Diagnosis not present

## 2019-10-13 DIAGNOSIS — J449 Chronic obstructive pulmonary disease, unspecified: Secondary | ICD-10-CM | POA: Diagnosis not present

## 2019-10-13 DIAGNOSIS — E538 Deficiency of other specified B group vitamins: Secondary | ICD-10-CM | POA: Diagnosis not present

## 2019-10-13 DIAGNOSIS — G4733 Obstructive sleep apnea (adult) (pediatric): Secondary | ICD-10-CM | POA: Diagnosis not present

## 2019-10-13 DIAGNOSIS — M170 Bilateral primary osteoarthritis of knee: Secondary | ICD-10-CM | POA: Diagnosis not present

## 2019-10-25 DIAGNOSIS — H26491 Other secondary cataract, right eye: Secondary | ICD-10-CM | POA: Diagnosis not present

## 2019-11-01 DIAGNOSIS — H524 Presbyopia: Secondary | ICD-10-CM | POA: Diagnosis not present

## 2019-11-01 DIAGNOSIS — Z961 Presence of intraocular lens: Secondary | ICD-10-CM | POA: Diagnosis not present

## 2019-11-13 DIAGNOSIS — E538 Deficiency of other specified B group vitamins: Secondary | ICD-10-CM | POA: Diagnosis not present

## 2019-11-21 DIAGNOSIS — H905 Unspecified sensorineural hearing loss: Secondary | ICD-10-CM | POA: Diagnosis not present

## 2019-12-13 DIAGNOSIS — D708 Other neutropenia: Secondary | ICD-10-CM | POA: Diagnosis not present

## 2019-12-13 DIAGNOSIS — D696 Thrombocytopenia, unspecified: Secondary | ICD-10-CM | POA: Diagnosis not present

## 2019-12-13 DIAGNOSIS — R5383 Other fatigue: Secondary | ICD-10-CM | POA: Diagnosis not present

## 2019-12-13 DIAGNOSIS — E538 Deficiency of other specified B group vitamins: Secondary | ICD-10-CM | POA: Diagnosis not present

## 2019-12-19 DIAGNOSIS — Z77098 Contact with and (suspected) exposure to other hazardous, chiefly nonmedicinal, chemicals: Secondary | ICD-10-CM | POA: Diagnosis not present

## 2019-12-19 DIAGNOSIS — E538 Deficiency of other specified B group vitamins: Secondary | ICD-10-CM | POA: Diagnosis not present

## 2019-12-19 DIAGNOSIS — Z7289 Other problems related to lifestyle: Secondary | ICD-10-CM | POA: Diagnosis not present

## 2019-12-19 DIAGNOSIS — D696 Thrombocytopenia, unspecified: Secondary | ICD-10-CM | POA: Diagnosis not present

## 2019-12-19 DIAGNOSIS — R634 Abnormal weight loss: Secondary | ICD-10-CM | POA: Diagnosis not present

## 2019-12-21 DIAGNOSIS — I10 Essential (primary) hypertension: Secondary | ICD-10-CM | POA: Diagnosis not present

## 2019-12-21 DIAGNOSIS — Z6824 Body mass index (BMI) 24.0-24.9, adult: Secondary | ICD-10-CM | POA: Diagnosis not present

## 2019-12-21 DIAGNOSIS — D729 Disorder of white blood cells, unspecified: Secondary | ICD-10-CM | POA: Diagnosis not present

## 2019-12-21 DIAGNOSIS — R634 Abnormal weight loss: Secondary | ICD-10-CM | POA: Diagnosis not present

## 2019-12-21 DIAGNOSIS — G4730 Sleep apnea, unspecified: Secondary | ICD-10-CM | POA: Diagnosis not present

## 2020-01-04 ENCOUNTER — Other Ambulatory Visit: Payer: Self-pay | Admitting: Adult Health

## 2020-01-10 DIAGNOSIS — E538 Deficiency of other specified B group vitamins: Secondary | ICD-10-CM | POA: Diagnosis not present

## 2020-01-17 ENCOUNTER — Telehealth: Payer: Self-pay | Admitting: Adult Health

## 2020-01-17 NOTE — Telephone Encounter (Signed)
..   Pt understands that although there may be some limitations with this type of visit, we will take all precautions to reduce any security or privacy concerns.  Pt understands that this will be treated like an in office visit and we will file with pt's insurance, and there may be a patient responsible charge related to this service. ? ?

## 2020-01-18 ENCOUNTER — Encounter: Payer: Self-pay | Admitting: *Deleted

## 2020-01-18 ENCOUNTER — Telehealth (INDEPENDENT_AMBULATORY_CARE_PROVIDER_SITE_OTHER): Payer: Medicare Other | Admitting: Adult Health

## 2020-01-18 DIAGNOSIS — Z9989 Dependence on other enabling machines and devices: Secondary | ICD-10-CM

## 2020-01-18 DIAGNOSIS — G4733 Obstructive sleep apnea (adult) (pediatric): Secondary | ICD-10-CM | POA: Diagnosis not present

## 2020-01-18 MED ORDER — MEMANTINE HCL 10 MG PO TABS: 10.0000 mg | ORAL_TABLET | Freq: Two times a day (BID) | ORAL | 3 refills | Status: DC

## 2020-01-18 NOTE — Progress Notes (Signed)
PATIENT: Mark Solomon DOB: 08-Jul-1940  REASON FOR VISIT: follow up HISTORY FROM: patient  Virtual Visit via Video Note  I connected with Mark Solomon on 01/18/20 at  8:00 AM EST by a video enabled telemedicine application located remotely at [facility] and verified that I am speaking with the correct person using two identifiers who was located at their own home.   I discussed the limitations of evaluation and management by telemedicine and the availability of in person appointments. The patient expressed understanding and agreed to proceed.   PATIENT: Mark Solomon DOB: 1940/01/19  REASON FOR VISIT: follow up HISTORY FROM: patient  HISTORY OF PRESENT ILLNESS: Today 01/18/20:  Mr. Mark Solomon is a 80 year old male with a history of obstructive sleep apnea on CPAP.  His download indicates that he uses machine nightly for compliance of 100%.  He uses machine greater than 4 hours each night.  On average he uses his machine 8 hours and 23 minutes.  His residual AHI is 6.8 on 5 to 17 cm of water with EPR 3.  Leak in the 95th percentile is 98.9 L/min the patient is not aware that the mask is leaking.  Wife reports that his memory has has remained the same possibly worse.  She has not noticed much of a difference since starting Namenda.  He is able to complete all ADLs independently.  He only drives for short distances.  They complete finances together.  He is hard of hearing.  Wife helps him with his medications and appointments. he was unable to tolerate Aricept due to diarrhea.  He is currently taking Namenda 5 mg twice a day    REVIEW OF SYSTEMS: Out of a complete 14 system review of symptoms, the patient complains only of the following symptoms, and all other reviewed systems are negative.  ALLERGIES: Allergies  Allergen Reactions  . Aricept [Donepezil] Diarrhea    HOME MEDICATIONS: Outpatient Medications Prior to Visit  Medication Sig Dispense  Refill  . beclomethasone (QVAR) 40 MCG/ACT inhaler Inhale 2 puffs into the lungs daily.     Marland Kitchen docusate sodium (COLACE) 50 MG capsule Take 50 mg by mouth daily as needed for mild constipation.    . fluticasone (FLONASE) 50 MCG/ACT nasal spray Place 2 sprays into both nostrils every morning.    Marland Kitchen ketoconazole (NIZORAL) 2 % shampoo Apply 1 application topically every other day.     . losartan (COZAAR) 50 MG tablet Take 50 mg by mouth at bedtime.     . memantine (NAMENDA) 5 MG tablet Take 1 tablet (5 mg total) by mouth 2 (two) times daily. 60 tablet 5  . Multiple Vitamins-Minerals (MULTIVITAMIN WITH MINERALS) tablet Take 1 tablet by mouth at bedtime.     . naproxen sodium (ALEVE) 220 MG tablet Take 440 mg by mouth at bedtime as needed (pain).    . simvastatin (ZOCOR) 40 MG tablet Take 40 mg by mouth at bedtime.     . tamsulosin (FLOMAX) 0.4 MG CAPS capsule Take 0.4 mg by mouth at bedtime.      No facility-administered medications prior to visit.    PAST MEDICAL HISTORY: Past Medical History:  Diagnosis Date  . Aortic valve sclerosis   . Arthritis   . COPD (chronic obstructive pulmonary disease) (HCC)   . Diastolic dysfunction   . Hyperlipidemia   . Hypertension   . Kidney stone   . Sleep apnea     PAST SURGICAL HISTORY: Past Surgical  History:  Procedure Laterality Date  . CARPAL TUNNEL RELEASE Bilateral   . CATARACT EXTRACTION Bilateral   . COLONOSCOPY W/ BIOPSIES AND POLYPECTOMY    . ESOPHAGEAL DILATION N/A 01/07/2018   Procedure: ESOPHAGEAL DILATION;  Surgeon: Rogene Houston, MD;  [facility];  Service: Endoscopy;  Laterality: N/A;  . ESOPHAGOGASTRODUODENOSCOPY (EGD) WITH PROPOFOL N/A 01/07/2018   Procedure: ESOPHAGOGASTRODUODENOSCOPY (EGD) WITH PROPOFOL;  Surgeon: Rogene Houston, MD;  [facility];  Service: Endoscopy;  Laterality: N/A;  10:15  . HAMMER TOE SURGERY    . JOINT REPLACEMENT    . KNEE ARTHROSCOPY W/ MENISCAL REPAIR     right   .  MULTIPLE TOOTH EXTRACTIONS    . NM MYOCAR PERF WALL MOTION  11/14/2008   Normal  . REPLACEMENT TOTAL KNEE  1994   left  . ROTATOR CUFF REPAIR Bilateral approx 20 years   [place], [place]   . TOTAL KNEE ARTHROPLASTY Right 02/08/2015   Procedure: TOTAL KNEE ARTHROPLASTY;  Surgeon: Earlie Server, MD;  Location: [place];  Service: Orthopedics;  Laterality: Right;  . US ECHOCARDIOGRAPHY  01/23/10   AOV appears sclerotic, borderline LA enlargement    FAMILY HISTORY: Family History  Problem Relation Age of Onset  . Diabetes Mother   . Heart attack Mother   . Heart attack Father     SOCIAL HISTORY: Social History   Socioeconomic History  . Marital status: Married    Spouse name: Not on file  . Number of children: Not on file  . Years of education: Not on file  . Highest education level: Not on file  Occupational History  . Not on file  Tobacco Use  . Smoking status: Former Smoker    Packs/day: 1.00    Years: 50.00    Pack years: 50.00    Types: Cigarettes    Quit date: 01/13/2011    Years since quitting: 9.0  . Smokeless tobacco: Never Used  Vaping Use  . Vaping Use: Never used  Substance and Sexual Activity  . Alcohol use: Yes    Alcohol/week: 14.0 standard drinks    Types: 14 Cans of beer per week  . Drug use: No  . Sexual activity: Not Currently    Birth control/protection: None  Other Topics Concern  . Not on file  Social History Narrative  . Not on file   Social Determinants of Health   Financial Resource Strain: Not on file  Food Insecurity: Not on file  Transportation Needs: Not on file  Physical Activity: Not on file  Stress: Not on file  Social Connections: Not on file  Intimate Partner Violence: Not on file      PHYSICAL EXAM Generalized: Well developed, in no acute distress   Neurological examination  Mentation: Alert oriented to person, place. Follows all commands speech and language fluent Cranial nerve II-XII:Extraocular movements were full.  Facial symmetry noted. uvula tongue midline. Head turning and shoulder shrug  were normal and symmetric. Motor: Good strength throughout subjectively per patient Sensory: Sensory testing is intact to soft touch on all 4 extremities subjectively per patient Gait and station: Patient is able to stand from a seated position. gait is normal.  Reflexes: UTA  DIAGNOSTIC DATA (LABS, IMAGING, TESTING) - I reviewed patient records, labs, notes, testing and imaging myself where available.  Lab Results  Component Value Date   WBC 3.4 (L) 01/03/2018   HGB 13.8 01/03/2018   HCT 42.2 01/03/2018   MCV 99.1 01/03/2018  PLT 89 (L) 01/03/2018      Component Value Date/Time   NA 141 01/03/2018 1004   K 3.4 (L) 01/03/2018 1004   CL 107 01/03/2018 1004   CO2 26 01/03/2018 1004   GLUCOSE 83 01/03/2018 1004   BUN 13 01/03/2018 1004   CREATININE 0.66 01/03/2018 1004   CALCIUM 9.0 01/03/2018 1004   PROT 6.5 01/28/2015 1243   ALBUMIN 4.1 01/28/2015 1243   AST 33 01/28/2015 1243   ALT 24 01/28/2015 1243   ALKPHOS 58 01/28/2015 1243   BILITOT 0.6 01/28/2015 1243   GFRNONAA >60 01/03/2018 1004   GFRAA >60 01/03/2018 1004     ASSESSMENT AND PLAN 80 y.o. year old male  has a past medical history of Aortic valve sclerosis, Arthritis, COPD (chronic obstructive pulmonary disease) (HCC), Diastolic dysfunction, Hyperlipidemia, Hypertension, Kidney stone, and Sleep apnea. here with:  OSA on CPAP  . CPAP compliance excellent . Residual AHI is good . High leak- will send for mask refitting . Encouraged patient to continue using CPAP nightly and > 4 hours each night  Memory disturbance  Unable to do MMSE virtually as wife was giving him the answers  Will increase Namenda to 10 mg BID. He will titrate up 5 mg in AM and 10 mg in the PM for 1 week then begin new prescription 10 mg BID. . F/U in 3 months for MMSE  I spent 20 minutes of face-to-face and non-face-to-face time with patient.  This included  previsit chart review, lab review, study review, order entry, electronic health record documentation, patient education.  Butch Penny, MSN, NP-C 01/18/2020, 8:54 AM [facility] [address], [address] [place], [place] [address] [phone]

## 2020-01-20 DIAGNOSIS — G4733 Obstructive sleep apnea (adult) (pediatric): Secondary | ICD-10-CM | POA: Diagnosis not present

## 2020-01-28 IMAGING — US ULTRASOUND ABDOMEN COMPLETE
1 series · 14 of 25 positions shown · non-contrast
Comparison: CT scan of September 09, 2017.

CLINICAL DATA: Thrombocytopenia, portal hypertension.

EXAM:
ABDOMEN ULTRASOUND COMPLETE

[Series 1: ultrasound abdomen complete · 0.21mm/px · 14 of 97 slices shown]
[im 1/97]
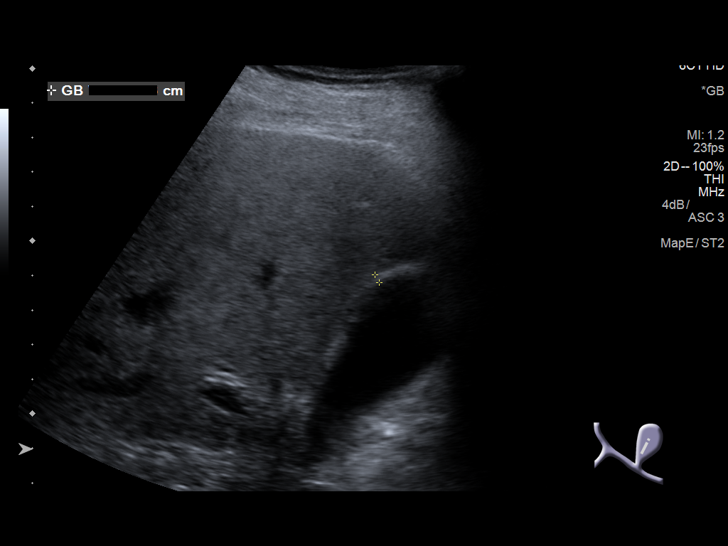
[im 9/97]
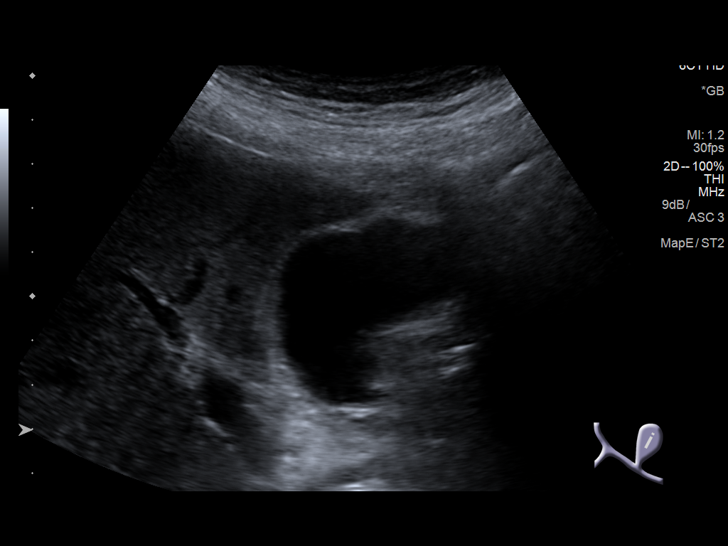
[im 17/97]
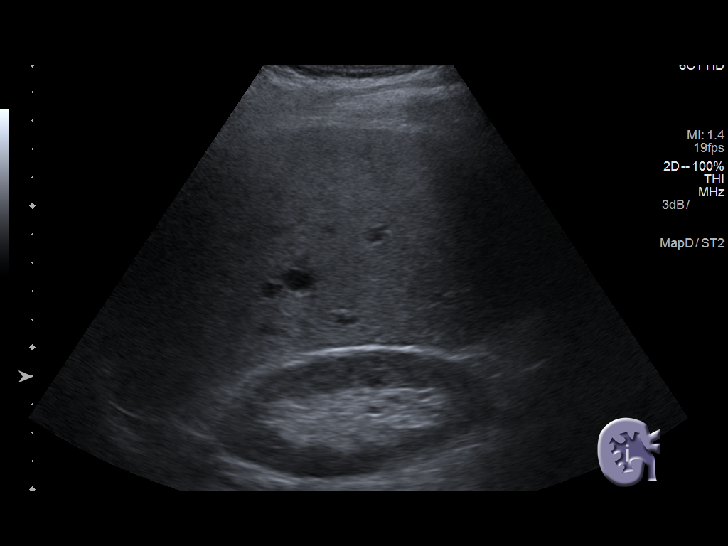
[im 25/97]
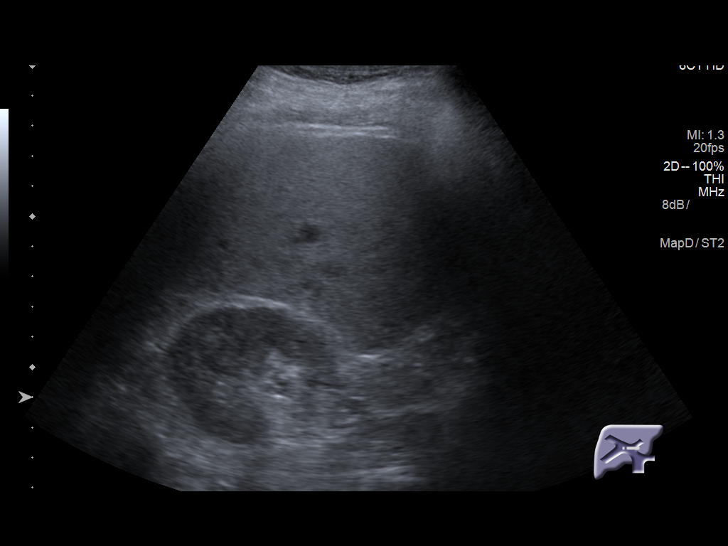
[im 33/97]
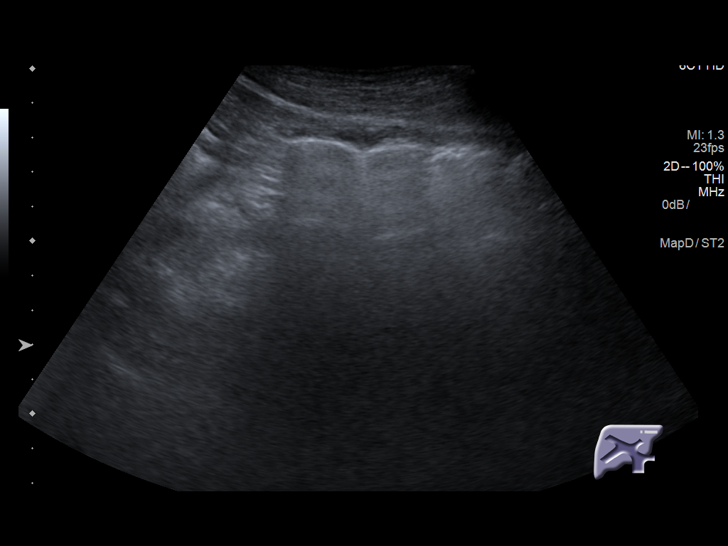
[im 37/97]
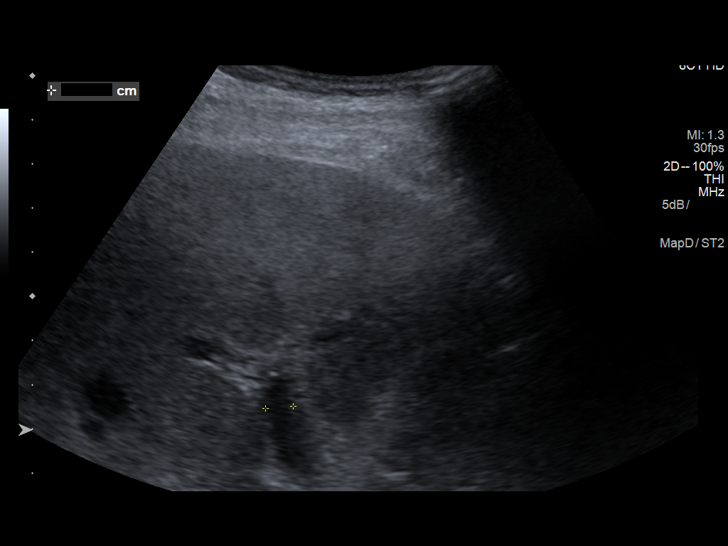
[im 45/97]
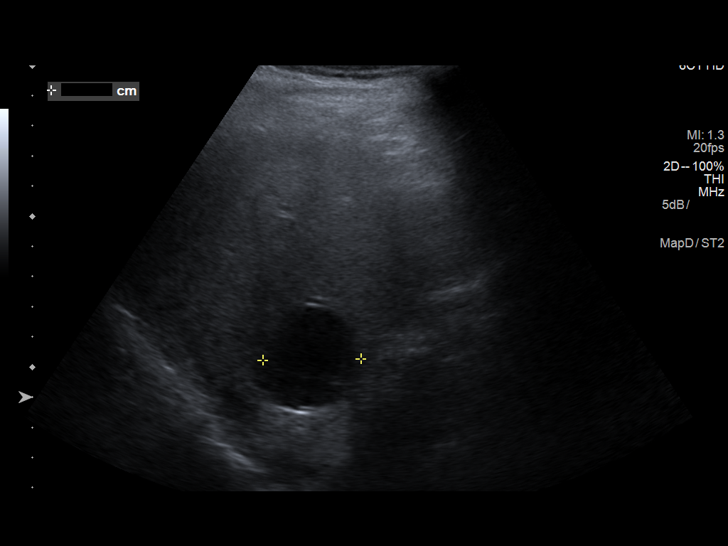
[im 53/97]
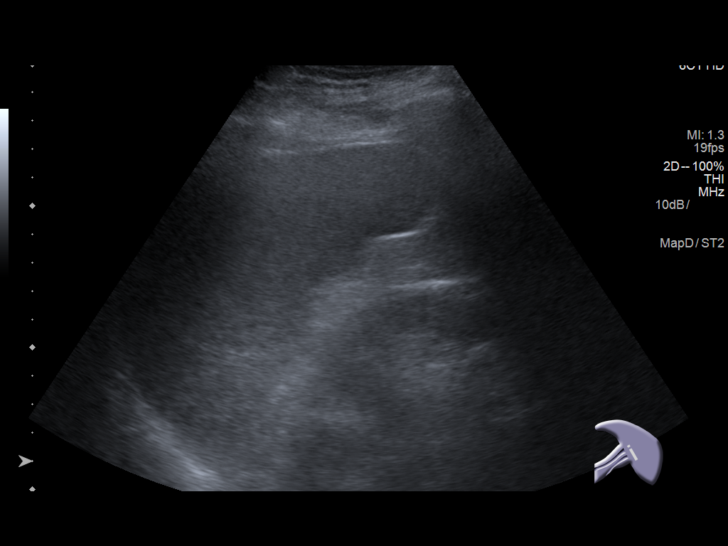
[im 61/97]
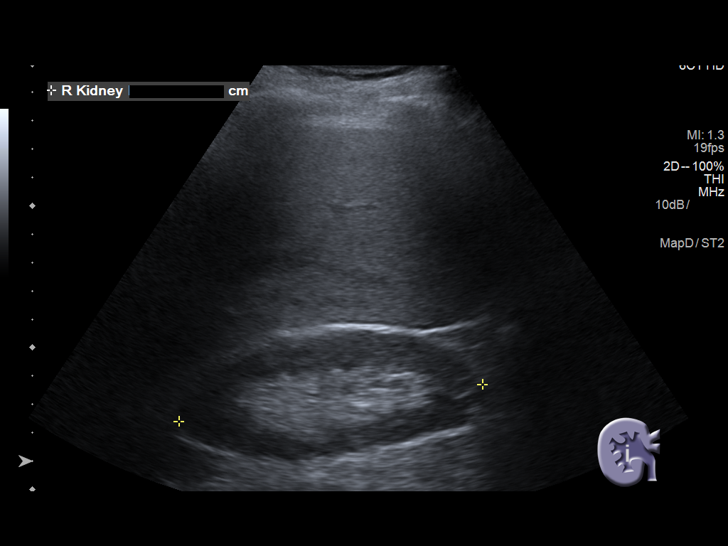
[im 65/97]
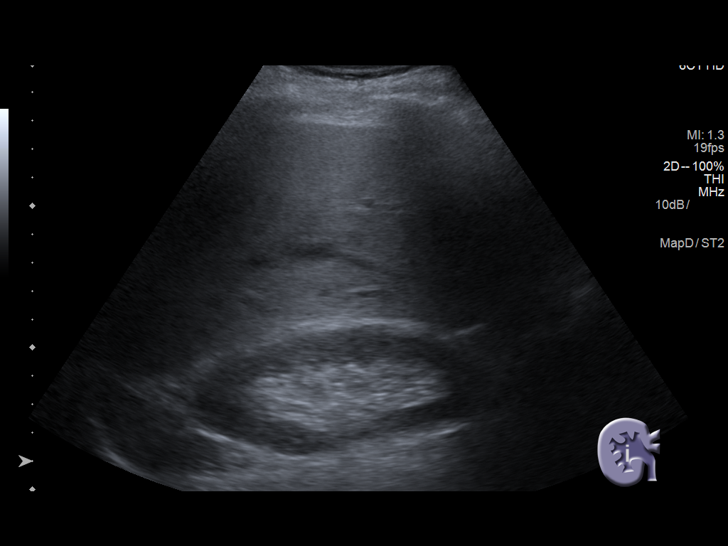
[im 73/97]
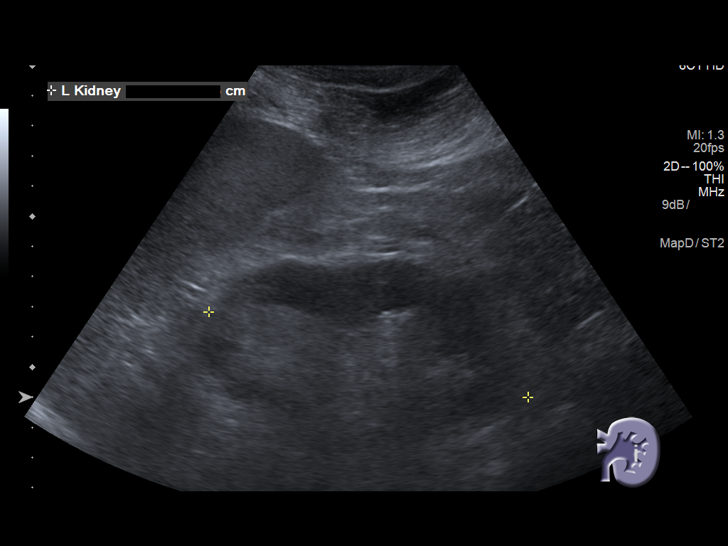
[im 81/97]
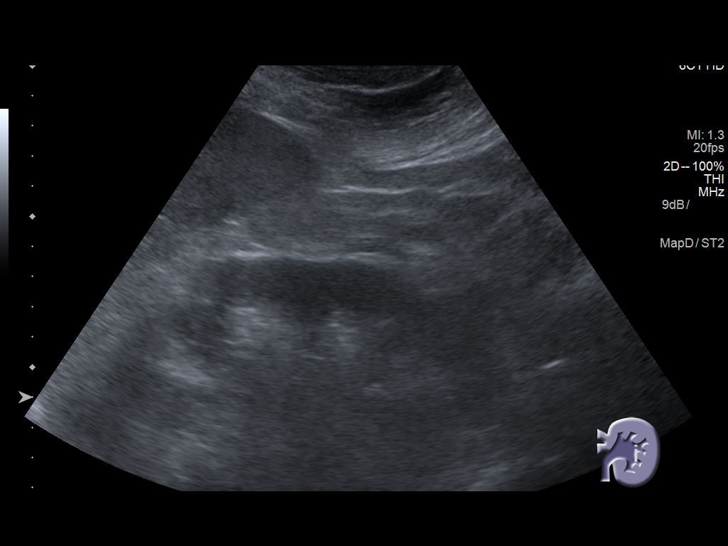
[im 89/97]
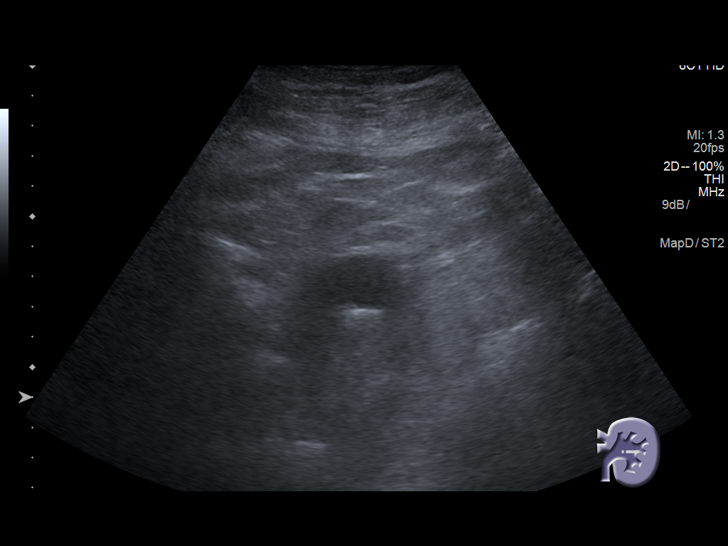
[im 97/97]
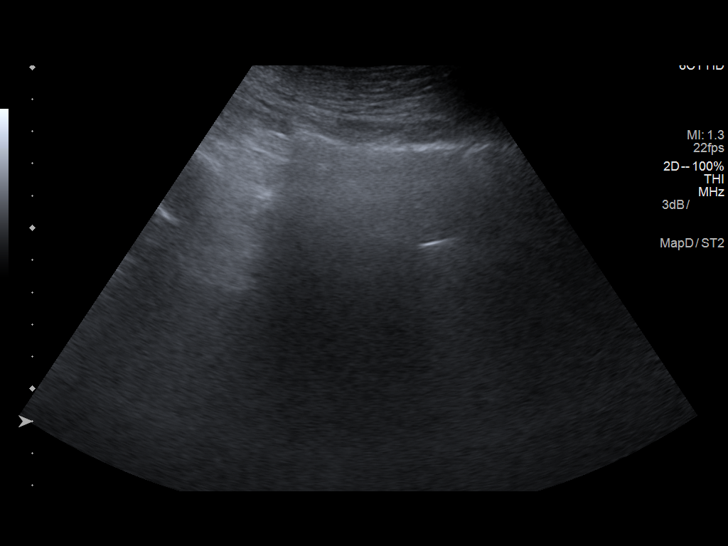

[14 of 25 positions shown; findings below may reference images not displayed]

FINDINGS: Gallbladder: No gallstones or wall thickening visualized. No
sonographic Murphy sign noted by sonographer.

Common bile duct: Diameter: 4 mm which is within normal limits.

Liver: 3.4 cm right hepatic cyst is noted. Increased echogenicity of
hepatic parenchyma is noted. Portal vein is patent on color Doppler
imaging with normal direction of blood flow towards the liver.

IVC: No abnormality visualized.

Pancreas: Visualized portion unremarkable.

Spleen: Size and appearance within normal limits.

Right Kidney: Length: 10.8 cm. Echogenicity within normal limits. No
mass or hydronephrosis visualized.

Left Kidney: Length: 11 cm. 9 mm nonobstructive calculus seen in
lower pole. Echogenicity within normal limits. No mass or
hydronephrosis visualized.

Abdominal aorta: No aneurysm visualized.

Other findings: None.
IMPRESSION: Probable hepatic steatosis. Normal right hepatic cyst.
Nonobstructive left renal calculus. No hydronephrosis or renal
obstruction is noted.

## 2020-01-31 ENCOUNTER — Telehealth: Payer: Self-pay | Admitting: Adult Health

## 2020-01-31 DIAGNOSIS — Z9989 Dependence on other enabling machines and devices: Secondary | ICD-10-CM

## 2020-01-31 DIAGNOSIS — G4733 Obstructive sleep apnea (adult) (pediatric): Secondary | ICD-10-CM

## 2020-01-31 NOTE — Telephone Encounter (Signed)
Pt.'s wife Lauris Poag is on [place]. She states [employer] in [place] had a nasal mask ordered & not a full face mask. Please advise.

## 2020-02-01 NOTE — Telephone Encounter (Signed)
I called and LMVM for wife of pt.  Last order I have is 03/2019.  [facility]aw [facility] for DME mask fit.  If it is about a mask should be in touch with them, but don't hesitate to call me back.

## 2020-02-01 NOTE — Telephone Encounter (Signed)
Pt's wife returned call. Please call back when available.  °

## 2020-02-05 NOTE — Telephone Encounter (Signed)
Spoke to wife of pt.  She went to pick up mask and was a nasal mask.  I relayed that last order in 03/2019 was for mask refitting and they would fit for the type of mask that would work for him..   She states he uses full face mask.  No order placed, placed new order and did put in about full face mask.  She appreciated this.  Fax confirmation [facility] [phone].

## 2020-02-05 NOTE — Addendum Note (Signed)
Addended by: Guy Begin on: 02/05/2020 10:35 AM   Modules accepted: Orders

## 2020-02-06 NOTE — Telephone Encounter (Signed)
Order faxed to [facility] for full face mask. Fax confirmation received.

## 2020-02-07 DIAGNOSIS — E538 Deficiency of other specified B group vitamins: Secondary | ICD-10-CM | POA: Diagnosis not present

## 2020-04-25 ENCOUNTER — Ambulatory Visit: Payer: Medicare Other | Admitting: Adult Health

## 2020-04-25 ENCOUNTER — Encounter: Payer: Self-pay | Admitting: Adult Health

## 2020-04-25 VITALS — BP 102/59 | HR 71 | Ht 70.0 in | Wt 169.0 lb

## 2020-04-25 DIAGNOSIS — G4733 Obstructive sleep apnea (adult) (pediatric): Secondary | ICD-10-CM

## 2020-04-25 DIAGNOSIS — Z9989 Dependence on other enabling machines and devices: Secondary | ICD-10-CM

## 2020-04-25 NOTE — Progress Notes (Signed)
PATIENT: Mark Solomon DOB: Jul 12, 1940  REASON FOR VISIT: follow up HISTORY FROM: patient  HISTORY OF PRESENT ILLNESS: Today 04/25/20:  [name] is a 80 year old male with a history of obstructive sleep apnea on CPAP and memory disturbance.  Sleep apnea: Reports that the CPAP is working well.  They did not do mask refitting after the last visit.  He does not feel the mask leaking night.  The wife states occasionally she does hear it.  Memory disturbance: The patient and his wife feel that his memory has declined.  He continues to live at home with his wife.  He is able to complete all ADLs independently.  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  Reports decrease in appetite.  His wife supervises his medications.  She also keeps up with all the appointments.  He returns today for an evaluation.     HISTORY 01/18/20:  Mr. Mark Solomon is a 80 year old male with a history of obstructive sleep apnea on CPAP.  His download indicates that he uses machine nightly for compliance of 100%.  He uses machine greater than 4 hours each night.  On average he uses his machine 8 hours and 23 minutes.  His residual AHI is 6.8 on 5 to 17 cm of water with EPR 3.  Leak in the 95th percentile is 98.9 L/min the patient is not aware that the mask is leaking.  Wife reports that his memory has has remained the same possibly worse.  She has not noticed much of a difference since starting Namenda.  He is able to complete all ADLs independently.  He only drives for short distances.  They complete finances together.  He is hard of hearing.  Wife helps him with his medications and appointments. he was unable to tolerate Aricept due to diarrhea.  He is currently taking Namenda 5 mg twice a day  REVIEW OF SYSTEMS: Out of a complete 14 system review of symptoms, the patient complains only of the following symptoms, and all other reviewed systems are negative.  FSS 53 ESS  12  ALLERGIES: Allergies  Allergen Reactions  . Aricept [Donepezil] Diarrhea    HOME MEDICATIONS: Outpatient Medications Prior to Visit  Medication Sig Dispense Refill  . beclomethasone (QVAR) 40 MCG/ACT inhaler Inhale 2 puffs into the lungs daily.     Marland Kitchen docusate sodium (COLACE) 50 MG capsule Take 50 mg by mouth daily as needed for mild constipation.    . fluticasone (FLONASE) 50 MCG/ACT nasal spray Place 2 sprays into both nostrils every morning.    Marland Kitchen ketoconazole (NIZORAL) 2 % shampoo Apply 1 application topically every other day.     . losartan (COZAAR) 50 MG tablet Take 50 mg by mouth at bedtime.     . memantine (NAMENDA) 10 MG tablet Take 1 tablet (10 mg total) by mouth 2 (two) times daily. 180 tablet 3  . Multiple Vitamins-Minerals (MULTIVITAMIN WITH MINERALS) tablet Take 1 tablet by mouth at bedtime.     . naproxen sodium (ALEVE) 220 MG tablet Take 440 mg by mouth at bedtime as needed (pain).    . simvastatin (ZOCOR) 40 MG tablet Take 40 mg by mouth at bedtime.     . tamsulosin (FLOMAX) 0.4 MG CAPS capsule Take 0.4 mg by mouth at bedtime.      No facility-administered medications prior to visit.    PAST MEDICAL HISTORY: Past Medical History:  Diagnosis Date  . Aortic valve sclerosis   .  Arthritis   . COPD (chronic obstructive pulmonary disease) (HCC)   . Diastolic dysfunction   . Hyperlipidemia   . Hypertension   . Kidney stone   . Sleep apnea     PAST SURGICAL HISTORY: Past Surgical History:  Procedure Laterality Date  . CARPAL TUNNEL RELEASE Bilateral   . CATARACT EXTRACTION Bilateral   . COLONOSCOPY W/ BIOPSIES AND POLYPECTOMY    . ESOPHAGEAL DILATION N/A 01/07/2018   Procedure: ESOPHAGEAL DILATION;  Surgeon: Malissa Hippo, MD;  [facility];  Service: Endoscopy;  Laterality: N/A;  . ESOPHAGOGASTRODUODENOSCOPY (EGD) WITH PROPOFOL N/A 01/07/2018   Procedure: ESOPHAGOGASTRODUODENOSCOPY (EGD) WITH PROPOFOL;  Surgeon: Malissa Hippo, MD;  [facility];  Service: Endoscopy;  Laterality: N/A;  10:15  . HAMMER TOE SURGERY    . JOINT REPLACEMENT    . KNEE ARTHROSCOPY W/ MENISCAL REPAIR     right   . MULTIPLE TOOTH EXTRACTIONS    . NM MYOCAR PERF WALL MOTION  11/14/2008   Normal  . REPLACEMENT TOTAL KNEE  1994   left  . ROTATOR CUFF REPAIR Bilateral approx 20 years   [place], [place]   . TOTAL KNEE ARTHROPLASTY Right 02/08/2015   Procedure: TOTAL KNEE ARTHROPLASTY;  Surgeon: Frederico Hamman, MD;  Location: [facility] OR;  Service: Orthopedics;  Laterality: Right;  . US ECHOCARDIOGRAPHY  01/23/10   AOV appears sclerotic, borderline LA enlargement    FAMILY HISTORY: Family History  Problem Relation Age of Onset  . Diabetes Mother   . Heart attack Mother   . Heart attack Father     SOCIAL HISTORY: Social History   Socioeconomic History  . Marital status: Married    Spouse name: Not on file  . Number of children: Not on file  . Years of education: Not on file  . Highest education level: Not on file  Occupational History  . Not on file  Tobacco Use  . Smoking status: Former Smoker    Packs/day: 1.00    Years: 50.00    Pack years: 50.00    Types: Cigarettes    Quit date: 01/13/2011    Years since quitting: 9.2  . Smokeless tobacco: Never Used  Vaping Use  . Vaping Use: Never used  Substance and Sexual Activity  . Alcohol use: Yes    Alcohol/week: 14.0 standard drinks    Types: 14 Cans of beer per week  . Drug use: No  . Sexual activity: Not Currently    Birth control/protection: None  Other Topics Concern  . Not on file  Social History Narrative  . Not on file   Social Determinants of Health   Financial Resource Strain: Not on file  Food Insecurity: Not on file  Transportation Needs: Not on file  Physical Activity: Not on file  Stress: Not on file  Social Connections: Not on file  Intimate Partner Violence: Not on file      PHYSICAL EXAM  Vitals:   04/25/20 1014  BP: (!) 102/59  Pulse: 71   Weight: 169 lb (76.7 kg)  Height: 5\' 10"  (1.778 m)   Body mass index is 24.25 kg/m.   MMSE - Mini Mental State Exam 04/25/2020 07/06/2019 02/07/2019  Orientation to time 1 4 3   Orientation to Place 4 3 3   Registration 3 3 3   Attention/ Calculation 1 1 5   Recall 0 0 0  Language- name 2 objects 2 2 2   Language- repeat 0 1 1  Language- follow 3 step  command 2 3 3   Language- read & follow direction 1 1 1   Write a sentence (No Data) - 0  Write a sentence-comments cant complete - -  Copy design (No Data) - 0  Copy design-comments cant complete - -  Total score - - 21     Generalized: Well developed, in no acute distress  Chest: Lungs clear to auscultation bilaterally  Neurological examination  Mentation: Alert oriented to time, place, history taking. Follows all commands speech and language fluent Cranial nerve II-XII: Extraocular movements were full, visual field were full on confrontational test Head turning and shoulder shrug  were normal and symmetric. Motor: The motor testing reveals 5 over 5 strength of all 4 extremities. Good symmetric motor tone is noted throughout.  Sensory: Sensory testing is intact to soft touch on all 4 extremities. No evidence of extinction is noted.  Gait and station: Gait is normal.    DIAGNOSTIC DATA (LABS, IMAGING, TESTING) - I reviewed patient records, labs, notes, testing and imaging myself where available.  Lab Results  Component Value Date   WBC 3.4 (L) 01/03/2018   HGB 13.8 01/03/2018   HCT 42.2 01/03/2018   MCV 99.1 01/03/2018   PLT 89 (L) 01/03/2018      Component Value Date/Time   NA 141 01/03/2018 1004   K 3.4 (L) 01/03/2018 1004   CL 107 01/03/2018 1004   CO2 26 01/03/2018 1004   GLUCOSE 83 01/03/2018 1004   BUN 13 01/03/2018 1004   CREATININE 0.66 01/03/2018 1004   CALCIUM 9.0 01/03/2018 1004   PROT 6.5 01/28/2015 1243   ALBUMIN 4.1 01/28/2015 1243   AST 33 01/28/2015 1243   ALT 24 01/28/2015 1243   ALKPHOS 58  01/28/2015 1243   BILITOT 0.6 01/28/2015 1243   GFRNONAA >60 01/03/2018 1004   GFRAA >60 01/03/2018 1004     ASSESSMENT AND PLAN 80 y.o. year old male  has a past medical history of Aortic valve sclerosis, Arthritis, COPD (chronic obstructive pulmonary disease) (HCC), Diastolic dysfunction, Hyperlipidemia, Hypertension, Kidney stone, and Sleep apnea. here with:  1. OSA on CPAP  - CPAP compliance excellent - Good treatment of AHI  - Encourage patient to use CPAP nightly and > 4 hours each night  2. Memory disturbance  -- MMSE declined -- continue Namenda 10 mg twice a day -- Cannot tolerate Aricept  - F/U in 6 months or sooner if needed   01/05/2018, MSN, NP-C 04/25/2020, 10:34 AM [facility] [address] [place], [address] [place] [id number]

## 2020-04-25 NOTE — Patient Instructions (Signed)
Your Plan:  Continue Namenda 10 mg twice a day for memory Continue using CPAP nightly Mask refitting     Thank you for coming to see [place] at [facility]. I hope we have been able to provide you high quality care today.  You may receive a patient satisfaction survey over the next few weeks. We would appreciate your feedback and comments so that we may continue to improve ourselves and the health of our patients.

## 2020-10-30 ENCOUNTER — Telehealth: Payer: Self-pay | Admitting: *Deleted

## 2020-10-30 NOTE — Telephone Encounter (Signed)
Spoke to patient wife today patient is currently in Nursing Home(Rehab). Pt wife stated pt has not worn CPAP in facility. Faculty needed order and settings for CPAP machine in order for patient to wear. . Wife gave me permission to call the facility to speak to [facility]. Spoke to [place] she stated verbal order was enough and I can fax her the settings today. Spoke to Megan,NP was ok to fax information for CPAP settings. Faxed CPAP Settings today.10/30/2020.

## 2020-10-31 ENCOUNTER — Ambulatory Visit: Payer: Medicare Other | Admitting: Adult Health

## 2020-11-25 ENCOUNTER — Other Ambulatory Visit: Payer: Self-pay | Admitting: Adult Health

## 2020-12-13 ENCOUNTER — Emergency Department (HOSPITAL_COMMUNITY): Payer: Medicare Other

## 2020-12-13 ENCOUNTER — Other Ambulatory Visit: Payer: Self-pay

## 2020-12-13 ENCOUNTER — Inpatient Hospital Stay (HOSPITAL_COMMUNITY)
Admission: EM | Admit: 2020-12-13 | Discharge: 2020-12-17 | DRG: 640 | Disposition: A | Discharge status: Skilled Nursing Facility | Payer: Medicare Other | Source: Skilled Nursing Facility | Attending: Family Medicine | Admitting: Family Medicine

## 2020-12-13 DIAGNOSIS — G4733 Obstructive sleep apnea (adult) (pediatric): Secondary | ICD-10-CM | POA: Diagnosis present

## 2020-12-13 DIAGNOSIS — R680 Hypothermia, not associated with low environmental temperature: Secondary | ICD-10-CM | POA: Diagnosis present

## 2020-12-13 DIAGNOSIS — E860 Dehydration: Secondary | ICD-10-CM | POA: Diagnosis not present

## 2020-12-13 DIAGNOSIS — Z888 Allergy status to other drugs, medicaments and biological substances status: Secondary | ICD-10-CM

## 2020-12-13 DIAGNOSIS — Z87442 Personal history of urinary calculi: Secondary | ICD-10-CM

## 2020-12-13 DIAGNOSIS — Z96651 Presence of right artificial knee joint: Secondary | ICD-10-CM | POA: Diagnosis present

## 2020-12-13 DIAGNOSIS — D649 Anemia, unspecified: Secondary | ICD-10-CM | POA: Diagnosis present

## 2020-12-13 DIAGNOSIS — R4182 Altered mental status, unspecified: Secondary | ICD-10-CM

## 2020-12-13 DIAGNOSIS — Z66 Do not resuscitate: Secondary | ICD-10-CM | POA: Diagnosis present

## 2020-12-13 DIAGNOSIS — N400 Enlarged prostate without lower urinary tract symptoms: Secondary | ICD-10-CM | POA: Diagnosis present

## 2020-12-13 DIAGNOSIS — I10 Essential (primary) hypertension: Secondary | ICD-10-CM | POA: Diagnosis present

## 2020-12-13 DIAGNOSIS — D61818 Other pancytopenia: Secondary | ICD-10-CM | POA: Diagnosis present

## 2020-12-13 DIAGNOSIS — Z20822 Contact with and (suspected) exposure to covid-19: Secondary | ICD-10-CM | POA: Diagnosis present

## 2020-12-13 DIAGNOSIS — J449 Chronic obstructive pulmonary disease, unspecified: Secondary | ICD-10-CM | POA: Diagnosis present

## 2020-12-13 DIAGNOSIS — Z79899 Other long term (current) drug therapy: Secondary | ICD-10-CM

## 2020-12-13 DIAGNOSIS — T68XXXA Hypothermia, initial encounter: Secondary | ICD-10-CM | POA: Diagnosis present

## 2020-12-13 DIAGNOSIS — K219 Gastro-esophageal reflux disease without esophagitis: Secondary | ICD-10-CM | POA: Diagnosis present

## 2020-12-13 DIAGNOSIS — B9689 Other specified bacterial agents as the cause of diseases classified elsewhere: Secondary | ICD-10-CM | POA: Diagnosis present

## 2020-12-13 DIAGNOSIS — Z87891 Personal history of nicotine dependence: Secondary | ICD-10-CM

## 2020-12-13 DIAGNOSIS — F03918 Unspecified dementia, unspecified severity, with other behavioral disturbance: Secondary | ICD-10-CM | POA: Diagnosis present

## 2020-12-13 DIAGNOSIS — L89622 Pressure ulcer of left heel, stage 2: Secondary | ICD-10-CM | POA: Diagnosis present

## 2020-12-13 DIAGNOSIS — L8990 Pressure ulcer of unspecified site, unspecified stage: Secondary | ICD-10-CM | POA: Insufficient documentation

## 2020-12-13 DIAGNOSIS — E870 Hyperosmolality and hypernatremia: Secondary | ICD-10-CM | POA: Diagnosis present

## 2020-12-13 DIAGNOSIS — E782 Mixed hyperlipidemia: Secondary | ICD-10-CM | POA: Diagnosis present

## 2020-12-13 DIAGNOSIS — Z833 Family history of diabetes mellitus: Secondary | ICD-10-CM

## 2020-12-13 DIAGNOSIS — G9341 Metabolic encephalopathy: Secondary | ICD-10-CM | POA: Diagnosis present

## 2020-12-13 DIAGNOSIS — M1990 Unspecified osteoarthritis, unspecified site: Secondary | ICD-10-CM | POA: Diagnosis present

## 2020-12-13 DIAGNOSIS — Z8249 Family history of ischemic heart disease and other diseases of the circulatory system: Secondary | ICD-10-CM

## 2020-12-13 LAB — CBC WITH DIFFERENTIAL/PLATELET
Abs Immature Granulocytes: 0 10*3/uL (ref 0.00–0.07)
Basophils Absolute: 0 10*3/uL (ref 0.0–0.1)
Basophils Relative: 0 %
Eosinophils Absolute: 0.1 10*3/uL (ref 0.0–0.5)
Eosinophils Relative: 4 %
HCT: 36.9 % — ABNORMAL LOW (ref 39.0–52.0)
Hemoglobin: 11.7 g/dL — ABNORMAL LOW (ref 13.0–17.0)
Immature Granulocytes: 0 %
Lymphocytes Relative: 26 %
Lymphs Abs: 0.8 10*3/uL (ref 0.7–4.0)
MCH: 30.4 pg (ref 26.0–34.0)
MCHC: 31.7 g/dL (ref 30.0–36.0)
MCV: 95.8 fL (ref 80.0–100.0)
Monocytes Absolute: 0.2 10*3/uL (ref 0.1–1.0)
Monocytes Relative: 8 %
Neutro Abs: 1.9 10*3/uL (ref 1.7–7.7)
Neutrophils Relative %: 62 %
Platelets: 123 10*3/uL — ABNORMAL LOW (ref 150–400)
RBC: 3.85 MIL/uL — ABNORMAL LOW (ref 4.22–5.81)
RDW: 14.6 % (ref 11.5–15.5)
WBC: 3 10*3/uL — ABNORMAL LOW (ref 4.0–10.5)
nRBC: 0 % (ref 0.0–0.2)

## 2020-12-13 LAB — COMPREHENSIVE METABOLIC PANEL
ALT: 16 U/L (ref 0–44)
AST: 19 U/L (ref 15–41)
Albumin: 3.6 g/dL (ref 3.5–5.0)
Alkaline Phosphatase: 111 U/L (ref 38–126)
Anion gap: 7 (ref 5–15)
BUN: 22 mg/dL (ref 8–23)
CO2: 30 mmol/L (ref 22–32)
Calcium: 9.2 mg/dL (ref 8.9–10.3)
Chloride: 107 mmol/L (ref 98–111)
Creatinine, Ser: 0.75 mg/dL (ref 0.61–1.24)
GFR, Estimated: >60 mL/min (ref >60)
Glucose, Bld: 104 mg/dL — ABNORMAL HIGH (ref 70–99)
Potassium: 4.3 mmol/L (ref 3.5–5.1)
Sodium: 144 mmol/L (ref 135–145)
Total Bilirubin: 0.1 mg/dL — ABNORMAL LOW (ref 0.3–1.2)
Total Protein: 6.2 g/dL — ABNORMAL LOW (ref 6.5–8.1)

## 2020-12-13 LAB — CBG MONITORING, ED: Glucose-Capillary: 93 mg/dL (ref 70–99)

## 2020-12-13 LAB — RESP PANEL BY RT-PCR (FLU A&B, COVID) ARPGX2
Influenza A by PCR: NEGATIVE
Influenza B by PCR: NEGATIVE
SARS Coronavirus 2 by RT PCR: NEGATIVE

## 2020-12-13 LAB — LACTIC ACID, PLASMA
Lactic Acid, Venous: 1.3 mmol/L (ref 0.5–1.9)
Lactic Acid, Venous: 0.5 mmol/L (ref 0.5–1.9)

## 2020-12-13 MED ORDER — SODIUM CHLORIDE 0.9 % IV BOLUS
500.0000 mL | Freq: Once | INTRAVENOUS | Status: AC
  Administered 2020-12-13: 500 mL via INTRAVENOUS

## 2020-12-13 NOTE — ED Triage Notes (Signed)
Pt arrived by EMS from [place]; EMS states this occurred about 1 hour ago per the family, pt has pin point pupils, responses to pain, hx of dementia, family denies being aware pt taking any medication; vitals WNLs with EMS.

## 2020-12-13 NOTE — ED Notes (Signed)
Dr Effie Shy made aware of pt temp- bare hugger in place at this time.- instructed to continue use until pt temp is normal. .

## 2020-12-13 NOTE — ED Notes (Signed)
Put pt on bear hugger per RN

## 2020-12-13 NOTE — ED Provider Notes (Signed)
[facility] EMERGENCY DEPARTMENT Provider Note   CSN: [id number] Arrival date & time: 12/13/20  1736     History Chief Complaint  Patient presents with   Altered Mental Status    Mark Solomon is a 80 y.o. male.  HPI He presents from his nursing care facility where he was found to be altered, when family member visited him tonight.  Because of that encounter he was sent here for evaluation.  According to a caregiver that was with him earlier today, he was somewhat "lethargic," but no intervention was taken at that time.  Patient is in a skilled nursing facility following hip surgery, about 1 month ago.  He has DNR status and a MOST form indicating acceptance of treatment such as antibiotics and IV fluids.  According to patient's son, he is usually conversant and can talk about things in the past better than the present.  Patient's son is at the bedside and gives history.  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, 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   Cognitive decline 02/07/2019   Portal hypertensive gastropathy (HCC) 02/07/2019   Thrombocytopenia (HCC) 02/07/2019   Esophageal dysphagia 12/27/2017   OSA on CPAP 09/15/2017   Abnormal dreams 09/15/2017   Obstructive sleep apnea hypopnea, severe 09/15/2017   Cachexia (HCC) 09/15/2017   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   Snoring 09/15/2017   Primary localized osteoarthritis of right knee 02/08/2015    Past Surgical History:  Procedure Laterality Date   CARPAL TUNNEL RELEASE Bilateral    CATARACT EXTRACTION Bilateral    COLONOSCOPY W/ BIOPSIES AND POLYPECTOMY      ESOPHAGEAL DILATION N/A 01/07/2018   Procedure: ESOPHAGEAL DILATION;  Surgeon: Malissa Hippo, MD;  [facility];  Service: Endoscopy;  Laterality: N/A;   ESOPHAGOGASTRODUODENOSCOPY (EGD) WITH PROPOFOL N/A 01/07/2018   Procedure: ESOPHAGOGASTRODUODENOSCOPY (EGD) WITH PROPOFOL;  Surgeon: Malissa Hippo, MD;  [facility];  Service: Endoscopy;  Laterality: N/A;  10:15   HAMMER TOE SURGERY     JOINT REPLACEMENT     KNEE ARTHROSCOPY W/ MENISCAL REPAIR     right    MULTIPLE TOOTH EXTRACTIONS     NM MYOCAR PERF WALL MOTION  11/14/2008   Normal   REPLACEMENT TOTAL KNEE  1994   left   ROTATOR CUFF REPAIR Bilateral approx 20 years   [place]    TOTAL KNEE ARTHROPLASTY Right 02/08/2015   Procedure: TOTAL KNEE ARTHROPLASTY;  Surgeon: Frederico Hamman, MD;  Location: [facility] OR;  Service: Orthopedics;  Laterality: Right;   US ECHOCARDIOGRAPHY  01/23/10   AOV appears sclerotic, borderline LA enlargement       Family History  Problem Relation Age of Onset   Diabetes Mother    Heart attack Mother    Heart attack Father     Social History   Tobacco Use   Smoking status: Former    Packs/day: 1.00    Years: 50.00    Pack years: 50.00    Types: Cigarettes    Quit date:  01/13/2011    Years since quitting: 9.9   Smokeless tobacco: Never  Vaping Use   Vaping Use: Never used  Substance Use Topics   Alcohol use: Yes    Alcohol/week: 14.0 standard drinks    Types: 14 Cans of beer per week   Drug use: No    Home Medications Prior to Admission medications   Medication Sig Start Date End Date Taking? Authorizing Provider  folic acid (FOLVITE) 1 MG tablet Take 1 mg by mouth daily.   Yes [provider]  memantine (NAMENDA) 10 MG tablet TAKE 1 TABLET BY MOUTH TWICE DAILY Patient taking differently: Take 10 mg by mouth daily. 11/25/20  Yes Butch Penny, NP  Multiple Vitamins-Minerals (MULTIVITAMIN WITH MINERALS) tablet Take 1 tablet by mouth at bedtime.     Yes [provider]  pantoprazole (PROTONIX) 20 MG tablet Take 20 mg by mouth daily.   Yes [provider]  risperiDONE (RISPERDAL) 1 MG tablet Take 1 mg by mouth 2 (two) times daily.   Yes [provider]  simvastatin (ZOCOR) 40 MG tablet Take 40 mg by mouth at bedtime.    Yes [provider]  tamsulosin (FLOMAX) 0.4 MG CAPS capsule Take 0.4 mg by mouth at bedtime.    Yes [provider]  thiamine 100 MG tablet Take 100 mg by mouth daily.   Yes [provider]  beclomethasone (QVAR) 40 MCG/ACT inhaler Inhale 2 puffs into the lungs daily.  Patient not taking: Reported on 12/13/2020    [provider]  docusate sodium (COLACE) 50 MG capsule Take 50 mg by mouth daily as needed for mild constipation. Patient not taking: Reported on 12/13/2020    [provider]  fluticasone (FLONASE) 50 MCG/ACT nasal spray Place 2 sprays into both nostrils every morning. Patient not taking: Reported on 12/13/2020    [provider]  ketoconazole (NIZORAL) 2 % shampoo Apply 1 application topically every other day.  Patient not taking: Reported on 12/13/2020    [provider]  losartan (COZAAR) 50 MG tablet Take 50 mg by mouth at bedtime.  Patient not taking: Reported on 12/13/2020    [provider]  naproxen sodium (ALEVE) 220 MG tablet Take 440 mg by mouth at bedtime as needed (pain). Patient not taking: Reported on 12/13/2020    [provider]    Allergies    Aricept [donepezil]  Review of Systems   Review of Systems  Unable to perform ROS: Dementia   Physical Exam Updated Vital Signs BP (!) 101/48   Pulse 81   Temp (!) 97 F (36.1 C) (Rectal)   Resp 15   SpO2 96%   Physical Exam Vitals and nursing note reviewed.  Constitutional:      General: He is not in acute distress.    Appearance: He is well-developed. He is ill-appearing. He is not toxic-appearing or diaphoretic.  HENT:     Head:  Normocephalic and atraumatic.     Right Ear: External ear normal.     Left Ear: External ear normal.  Eyes:     Conjunctiva/sclera: Conjunctivae normal.     Comments: Pinpoint nipples bilaterally  Cardiovascular:     Rate and Rhythm: Regular rhythm. Bradycardia present.     Heart sounds: Normal heart sounds.  Pulmonary:     Effort: Pulmonary effort is normal.     Breath sounds: Normal breath sounds.  Abdominal:     Palpations: Abdomen is soft.     Tenderness:  There is no abdominal tenderness.  Musculoskeletal:        General: Tenderness (Right hip with passive range of motion.  No large joint deformities.) present.     Cervical back: Normal range of motion and neck supple.  Skin:    General: Skin is warm and dry.  Neurological:     Mental Status: He is alert.     Cranial Nerves: No cranial nerve deficit.     Motor: No abnormal muscle tone.     Comments: No focal strength or tone differences.  He has bilateral upper extremity flexion, and cannot be passively extended.  There is no apparent seizure activity at the time of exam.  Psychiatric:     Comments: Lethargic    ED Results / Procedures / Treatments   Labs (all labs ordered are listed, but only abnormal results are displayed) Labs Reviewed  COMPREHENSIVE METABOLIC PANEL - Abnormal; Notable for the following components:      Result Value   Glucose, Bld 104 (*)    Total Protein 6.2 (*)    Total Bilirubin 0.1 (*)    All other components within normal limits  CBC WITH DIFFERENTIAL/PLATELET - Abnormal; Notable for the following components:   WBC 3.0 (*)    RBC 3.85 (*)    Hemoglobin 11.7 (*)    HCT 36.9 (*)    Platelets 123 (*)    All other components within normal limits  RESP PANEL BY RT-PCR (FLU A&B, COVID) ARPGX2  CULTURE, BLOOD (ROUTINE X 2)  CULTURE, BLOOD (ROUTINE X 2)  LACTIC ACID, PLASMA  LACTIC ACID, PLASMA  URINALYSIS, ROUTINE W REFLEX MICROSCOPIC  CBG MONITORING, ED    EKG None  Radiology CT Head  Wo Contrast  Result Date: 12/13/2020 CLINICAL DATA:  Encephalopathy EXAM: CT HEAD WITHOUT CONTRAST TECHNIQUE: Contiguous axial images were obtained from the base of the skull through the vertex without intravenous contrast. COMPARISON:  None. FINDINGS: Brain: There is no mass, hemorrhage or extra-axial collection. The appearance of the white matter is normal for the patient's age. There is generalized atrophy. Vascular: No abnormal hyperdensity of the major intracranial arteries or dural venous sinuses. No intracranial atherosclerosis. Skull: The visualized skull base, calvarium and extracranial soft tissues are normal. Sinuses/Orbits: No fluid levels or advanced mucosal thickening of the visualized paranasal sinuses. No mastoid or middle ear effusion. The orbits are normal. IMPRESSION: Generalized atrophy without acute intracranial abnormality. Electronically Signed   By: Deatra Robinson M.D.   On: 12/13/2020 20:04   DG Chest Port 1 View  Result Date: 12/13/2020 CLINICAL DATA:  Altered mental status EXAM: PORTABLE CHEST 1 VIEW COMPARISON:  09/04/2020 FINDINGS: The heart size and mediastinal contours are within normal limits. Aortic atherosclerosis. Both lungs are clear. The visualized skeletal structures are unremarkable. Emphysematous disease. Postsurgical changes at the humeral heads. IMPRESSION: No active disease.  Emphysematous disease. Electronically Signed   By: Jasmine Pang M.D.   On: 12/13/2020 19:31    Procedures .Critical Care Performed by: Mancel Bale, MD Authorized by: Mancel Bale, MD   Critical care provider statement:    Critical care time (minutes):  45   Critical care start time:  12/13/2020 6:00 PM   Critical care end time:  12/13/2020 11:21 PM   Critical care time was exclusive of:  Separately billable procedures and treating other patients   Critical care was necessary to treat or prevent imminent or life-threatening deterioration of the following conditions:  CNS failure or  compromise  Critical care was time spent personally by me on the following activities:  Blood draw for specimens, development of treatment plan with patient or surrogate, discussions with consultants, evaluation of patient's response to treatment, examination of patient, ordering and performing treatments and interventions, ordering and review of laboratory studies, ordering and review of radiographic studies, pulse oximetry, re-evaluation of patient's condition and review of old charts   Medications Ordered in ED Medications  sodium chloride 0.9 % bolus 500 mL (0 mLs Intravenous Stopped 12/13/20 2148)    ED Course  I have reviewed the triage vital signs and the nursing notes.  Pertinent labs & imaging results that were available during my care of the patient were reviewed by me and considered in my medical decision making (see chart for details).  Clinical Course as of 12/13/20 2322  Fri Dec 13, 2020  7829 Suspect acute CNS abnormality, with wide differential.  Initiate therapy with warming blanket, and check CBG. [EW]    Clinical Course User Index [EW] Mancel Bale, MD   MDM Rules/Calculators/A&P                            Patient Vitals for the past 24 hrs:  BP Temp Temp src Pulse Resp SpO2  12/13/20 2300 (!) 101/48 -- -- 81 15 96 %  12/13/20 2247 -- (!) 97 F (36.1 C) Rectal -- -- --  12/13/20 2230 (!) 103/59 -- -- 76 19 98 %  12/13/20 2109 -- (!) 93.5 F (34.2 C) Rectal -- -- --  12/13/20 2100 111/88 -- -- 66 14 95 %  12/13/20 2030 101/70 -- -- 62 18 96 %  12/13/20 2020 132/84 -- -- 62 15 98 %  12/13/20 1930 102/64 -- -- 66 14 97 %  12/13/20 1900 (!) 150/53 -- -- 69 (!) 25 96 %  12/13/20 1800 117/62 (!) 95.1 F (35.1 C) Rectal 66 12 100 %    11:22 PM Reevaluation with update and discussion. After initial assessment and treatment, an updated evaluation reveals rectal temperature improved, mental status unchanged.  Son is not in the room at this time. Mancel Bale    Medical Decision Making:  This patient is presenting for evaluation of altered mental status, which does require a range of treatment options, and is a complaint that involves a high risk of morbidity and mortality. The differential diagnoses include sepsis, metabolic disorder, stroke. I decided to review old records, and in summary elderly male, debilitated with dementia, lives in a skilled facility is presenting for evaluation of altered mental status.  No known trauma.  History includes obstructive sleep apnea, COPD, subdural hematoma, cognitive decline.  I obtained additional historical information from son at bedside.  Clinical Laboratory Tests Ordered, included CBC, Metabolic panel, Urinalysis, and lactate, viral panel, blood cultures . Review indicates initial findings normal except glucose high, white count low, hemoglobin low. Radiologic Tests Ordered, included CT head, chest x-ray.  I independently Visualized: Radiographic images, which show no acute abnormality    Critical Interventions-clinical evaluation, laboratory testing, CT imaging, chest x-ray, warming blanket, observation and reassess  After These Interventions, the Patient was reevaluated and was found with persistent altered mental status.  Head CT does not show stroke.  Blood work does not indicate acute infectious process.  Temperature improved with warming blanket.  Doubt severe sepsis or metabolic instability.  He will require further evaluation with MRI imaging of the brain to evaluate for occult stroke.  Patient requires hospitalization for stabilization  CRITICAL CARE-yes Performed by: Mancel Bale  Nursing Notes Reviewed/ Care Coordinated Applicable Imaging Reviewed Interpretation of Laboratory Data incorporated into ED treatment   11:22 PM-Consult complete with hospitalist. Patient case explained and discussed. He agrees to admit patient, after more IV fluids to stabilize blood pressure, for further  evaluation and treatment. Call ended at 12:55 PM    Final Clinical Impression(s) / ED Diagnoses Final diagnoses:  Hypothermia, initial encounter  Altered mental status, unspecified altered mental status type    Rx / DC Orders ED Discharge Orders     None        Mancel Bale, MD 12/14/20 1449

## 2020-12-14 DIAGNOSIS — Z888 Allergy status to other drugs, medicaments and biological substances status: Secondary | ICD-10-CM | POA: Diagnosis not present

## 2020-12-14 DIAGNOSIS — J449 Chronic obstructive pulmonary disease, unspecified: Secondary | ICD-10-CM | POA: Diagnosis present

## 2020-12-14 DIAGNOSIS — Z87442 Personal history of urinary calculi: Secondary | ICD-10-CM | POA: Diagnosis not present

## 2020-12-14 DIAGNOSIS — M1990 Unspecified osteoarthritis, unspecified site: Secondary | ICD-10-CM | POA: Diagnosis present

## 2020-12-14 DIAGNOSIS — T68XXXA Hypothermia, initial encounter: Secondary | ICD-10-CM

## 2020-12-14 DIAGNOSIS — Z8249 Family history of ischemic heart disease and other diseases of the circulatory system: Secondary | ICD-10-CM | POA: Diagnosis not present

## 2020-12-14 DIAGNOSIS — L89622 Pressure ulcer of left heel, stage 2: Secondary | ICD-10-CM | POA: Diagnosis present

## 2020-12-14 DIAGNOSIS — E870 Hyperosmolality and hypernatremia: Secondary | ICD-10-CM | POA: Diagnosis present

## 2020-12-14 DIAGNOSIS — Z20822 Contact with and (suspected) exposure to covid-19: Secondary | ICD-10-CM | POA: Diagnosis present

## 2020-12-14 DIAGNOSIS — D649 Anemia, unspecified: Secondary | ICD-10-CM

## 2020-12-14 DIAGNOSIS — Z66 Do not resuscitate: Secondary | ICD-10-CM | POA: Diagnosis present

## 2020-12-14 DIAGNOSIS — K219 Gastro-esophageal reflux disease without esophagitis: Secondary | ICD-10-CM | POA: Diagnosis present

## 2020-12-14 DIAGNOSIS — B9689 Other specified bacterial agents as the cause of diseases classified elsewhere: Secondary | ICD-10-CM | POA: Diagnosis present

## 2020-12-14 DIAGNOSIS — I10 Essential (primary) hypertension: Secondary | ICD-10-CM | POA: Diagnosis present

## 2020-12-14 DIAGNOSIS — G9341 Metabolic encephalopathy: Secondary | ICD-10-CM | POA: Diagnosis present

## 2020-12-14 DIAGNOSIS — G4733 Obstructive sleep apnea (adult) (pediatric): Secondary | ICD-10-CM

## 2020-12-14 DIAGNOSIS — Z79899 Other long term (current) drug therapy: Secondary | ICD-10-CM | POA: Diagnosis not present

## 2020-12-14 DIAGNOSIS — R680 Hypothermia, not associated with low environmental temperature: Secondary | ICD-10-CM | POA: Diagnosis present

## 2020-12-14 DIAGNOSIS — R4182 Altered mental status, unspecified: Secondary | ICD-10-CM | POA: Diagnosis present

## 2020-12-14 DIAGNOSIS — N400 Enlarged prostate without lower urinary tract symptoms: Secondary | ICD-10-CM | POA: Insufficient documentation

## 2020-12-14 DIAGNOSIS — R400 Somnolence: Secondary | ICD-10-CM | POA: Diagnosis not present

## 2020-12-14 DIAGNOSIS — E782 Mixed hyperlipidemia: Secondary | ICD-10-CM

## 2020-12-14 DIAGNOSIS — Z833 Family history of diabetes mellitus: Secondary | ICD-10-CM | POA: Diagnosis not present

## 2020-12-14 DIAGNOSIS — F03918 Unspecified dementia, unspecified severity, with other behavioral disturbance: Secondary | ICD-10-CM | POA: Diagnosis present

## 2020-12-14 DIAGNOSIS — L8990 Pressure ulcer of unspecified site, unspecified stage: Secondary | ICD-10-CM | POA: Insufficient documentation

## 2020-12-14 DIAGNOSIS — Z87891 Personal history of nicotine dependence: Secondary | ICD-10-CM | POA: Diagnosis not present

## 2020-12-14 DIAGNOSIS — Z96651 Presence of right artificial knee joint: Secondary | ICD-10-CM | POA: Diagnosis present

## 2020-12-14 DIAGNOSIS — D61818 Other pancytopenia: Secondary | ICD-10-CM | POA: Diagnosis present

## 2020-12-14 DIAGNOSIS — Z9989 Dependence on other enabling machines and devices: Secondary | ICD-10-CM

## 2020-12-14 DIAGNOSIS — E860 Dehydration: Secondary | ICD-10-CM | POA: Diagnosis present

## 2020-12-14 LAB — CBC
HCT: 34.2 % — ABNORMAL LOW (ref 39.0–52.0)
Hemoglobin: 10.5 g/dL — ABNORMAL LOW (ref 13.0–17.0)
MCH: 29.3 pg (ref 26.0–34.0)
MCHC: 30.7 g/dL (ref 30.0–36.0)
MCV: 95.5 fL (ref 80.0–100.0)
Platelets: 119 10*3/uL — ABNORMAL LOW (ref 150–400)
RBC: 3.58 MIL/uL — ABNORMAL LOW (ref 4.22–5.81)
RDW: 14.2 % (ref 11.5–15.5)
WBC: 3.6 10*3/uL — ABNORMAL LOW (ref 4.0–10.5)
nRBC: 0 % (ref 0.0–0.2)

## 2020-12-14 LAB — APTT: aPTT: 27 seconds (ref 24–36)

## 2020-12-14 LAB — COMPREHENSIVE METABOLIC PANEL
ALT: 14 U/L (ref 0–44)
AST: 17 U/L (ref 15–41)
Albumin: 2.8 g/dL — ABNORMAL LOW (ref 3.5–5.0)
Alkaline Phosphatase: 90 U/L (ref 38–126)
Anion gap: 6 (ref 5–15)
BUN: 19 mg/dL (ref 8–23)
CO2: 25 mmol/L (ref 22–32)
Calcium: 8.5 mg/dL — ABNORMAL LOW (ref 8.9–10.3)
Chloride: 115 mmol/L — ABNORMAL HIGH (ref 98–111)
Creatinine, Ser: 0.67 mg/dL (ref 0.61–1.24)
GFR, Estimated: >60 mL/min (ref >60)
Glucose, Bld: 78 mg/dL (ref 70–99)
Potassium: 3.6 mmol/L (ref 3.5–5.1)
Sodium: 146 mmol/L — ABNORMAL HIGH (ref 135–145)
Total Bilirubin: 0.7 mg/dL (ref 0.3–1.2)
Total Protein: 5 g/dL — ABNORMAL LOW (ref 6.5–8.1)

## 2020-12-14 LAB — URINALYSIS, ROUTINE W REFLEX MICROSCOPIC
Bilirubin Urine: NEGATIVE
Glucose, UA: NEGATIVE mg/dL
Ketones, ur: NEGATIVE mg/dL
Nitrite: NEGATIVE
Protein, ur: 30 mg/dL — AB
Specific Gravity, Urine: >1.03 — ABNORMAL HIGH (ref 1.005–1.030)
pH: 6 (ref 5.0–8.0)

## 2020-12-14 LAB — RAPID URINE DRUG SCREEN, HOSP PERFORMED
Amphetamines: NOT DETECTED
Barbiturates: NOT DETECTED
Benzodiazepines: NOT DETECTED
Cocaine: NOT DETECTED
Opiates: NOT DETECTED
Tetrahydrocannabinol: NOT DETECTED

## 2020-12-14 LAB — URINALYSIS, MICROSCOPIC (REFLEX)

## 2020-12-14 LAB — BLOOD GAS, ARTERIAL
Acid-Base Excess: 3.6 mmol/L — ABNORMAL HIGH (ref 0.0–2.0)
Bicarbonate: 27.5 mmol/L (ref 20.0–28.0)
Drawn by: 22223
FIO2: 21
O2 Saturation: 97 %
Patient temperature: 37
pCO2 arterial: 43 mmHg (ref 32.0–48.0)
pH, Arterial: 7.426 (ref 7.350–7.450)
pO2, Arterial: 95.3 mmHg (ref 83.0–108.0)

## 2020-12-14 LAB — MAGNESIUM: Magnesium: 1.7 mg/dL (ref 1.7–2.4)

## 2020-12-14 LAB — PHOSPHORUS: Phosphorus: 3 mg/dL (ref 2.5–4.6)

## 2020-12-14 MED ORDER — FOLIC ACID 1 MG PO TABS
1.0000 mg | ORAL_TABLET | Freq: Every day | ORAL | Status: DC
  Administered 2020-12-14 – 2020-12-17 (×4): 1 mg via ORAL
  Filled 2020-12-14 (×4): qty 1

## 2020-12-14 MED ORDER — DEXTROSE-NACL 5-0.45 % IV SOLN: INTRAVENOUS | Status: DC

## 2020-12-14 MED ORDER — ENOXAPARIN SODIUM 40 MG/0.4ML IJ SOSY
40.0000 mg | PREFILLED_SYRINGE | Freq: Every day | INTRAMUSCULAR | Status: DC
  Administered 2020-12-14 – 2020-12-17 (×4): 40 mg via SUBCUTANEOUS
  Filled 2020-12-14 (×4): qty 0.4

## 2020-12-14 MED ORDER — SODIUM CHLORIDE 0.9 % IV BOLUS
2000.0000 mL | Freq: Once | INTRAVENOUS | Status: AC
  Administered 2020-12-14: 2000 mL via INTRAVENOUS

## 2020-12-14 MED ORDER — PANTOPRAZOLE SODIUM 20 MG PO TBEC
20.0000 mg | DELAYED_RELEASE_TABLET | Freq: Every day | ORAL | Status: DC
  Filled 2020-12-14 (×3): qty 1

## 2020-12-14 MED ORDER — LORAZEPAM 2 MG/ML IJ SOLN
1.0000 mg | Freq: Once | INTRAMUSCULAR | Status: AC
  Administered 2020-12-14: 1 mg via INTRAVENOUS
  Filled 2020-12-14: qty 1

## 2020-12-14 MED ORDER — SIMVASTATIN 20 MG PO TABS
40.0000 mg | ORAL_TABLET | Freq: Every day | ORAL | Status: DC
  Administered 2020-12-14 – 2020-12-16 (×3): 40 mg via ORAL
  Filled 2020-12-14 (×3): qty 2

## 2020-12-14 MED ORDER — MEMANTINE HCL 10 MG PO TABS
10.0000 mg | ORAL_TABLET | Freq: Every day | ORAL | Status: DC
  Administered 2020-12-14 – 2020-12-17 (×4): 10 mg via ORAL
  Filled 2020-12-14 (×4): qty 1

## 2020-12-14 MED ORDER — SODIUM CHLORIDE 0.9 % IV SOLN
1.0000 g | INTRAVENOUS | Status: DC
  Administered 2020-12-14 – 2020-12-16 (×3): 1 g via INTRAVENOUS
  Filled 2020-12-14 (×3): qty 10

## 2020-12-14 MED ORDER — PANTOPRAZOLE SODIUM 40 MG PO TBEC
40.0000 mg | DELAYED_RELEASE_TABLET | Freq: Every day | ORAL | Status: DC
  Administered 2020-12-14 – 2020-12-17 (×4): 40 mg via ORAL
  Filled 2020-12-14 (×4): qty 1

## 2020-12-14 MED ORDER — THIAMINE HCL 100 MG PO TABS
100.0000 mg | ORAL_TABLET | Freq: Every day | ORAL | Status: DC
  Administered 2020-12-14 – 2020-12-17 (×4): 100 mg via ORAL
  Filled 2020-12-14 (×4): qty 1

## 2020-12-14 MED ORDER — LORAZEPAM 2 MG/ML IJ SOLN
1.0000 mg | Freq: Two times a day (BID) | INTRAMUSCULAR | Status: DC | PRN
  Administered 2020-12-14 – 2020-12-15 (×2): 1 mg via INTRAVENOUS
  Filled 2020-12-14 (×3): qty 1

## 2020-12-14 MED ORDER — ADULT MULTIVITAMIN W/MINERALS CH
1.0000 | ORAL_TABLET | Freq: Every day | ORAL | Status: DC
  Administered 2020-12-14 – 2020-12-16 (×3): 1 via ORAL
  Filled 2020-12-14 (×3): qty 1

## 2020-12-14 NOTE — ED Provider Notes (Signed)
Patient is now more awake and alert, but does appear confused. His vital signs have  improved BP 122/64   Pulse 89   Temp (!) 97 F (36.1 C) (Rectal)   Resp 16   SpO2 100%  Patient will be admitted   Zadie Rhine, MD 12/14/20 0111

## 2020-12-14 NOTE — Progress Notes (Signed)
Patient appears to have a rash on his abdomen, back, and bottom. MD made aware. Will continue to monitor.

## 2020-12-14 NOTE — ED Notes (Addendum)
Pt is awake and talking at this time. Pt has dementia and is not oriented to place, time, or situation. Pt is able to tell me his name. Pt is alert and will talk, but conversation is fragmented and does not make sense. Pt unable to follow complex commands, but can do simple commands.  Pt not c/o pain or discomfort. Dr Bebe Shaggy aware.

## 2020-12-14 NOTE — Progress Notes (Signed)
RT obtained ABG and results in chart. Patient is confused and will not tolerate CPAP at this time. RN made aware and RT will have a unit on stand by when needed.

## 2020-12-14 NOTE — Progress Notes (Addendum)
Patient seen and evaluated, chart reviewed, please see EMR for updated orders. Please see full H&P dictated by admitting physician Dr. Thomes Dinning for same date of service.   Brief Summary:- 80 y.o. male with medical history significant for COPD, essential hypertension, GERD, mixed hyperlipidemia, BPH, sleep apnea on CPAP admitted from [facility] on 12/14/2020 with acute metabolic encephalopathy and dehydration and possible UTI requiring IV antibiotics and IV fluids   A/P 1)Acute metabolic encephalopathy-- ??  Dehydration and UTI related, - patient supposed to be on CPAP for OSA--get ABG to rule out hypercapnia -UDS unremarkable -Hypothermia has resolved  2)Possible UTI--- IV Rocephin pending culture data  3)Dehydration and HyperNatremia--D5 half-normal solution as ordered -Oral intake is not great due to agitation and lack of cooperation from patient  4)OSA--- patient supposed to be on CPAP for OSA--get ABG to rule out hypercapnia -RT to place CPAP nightly  5)Dementia with behavioral disturbance--- continue Namenda May use lorazepam as needed  6)Pancytopenia--WBC, Hgb and platelets are not far from baseline  7)H/o HTN--- BP is  soft, avoids BP meds at this time  Total care time 46 minutes  Patient seen and evaluated, chart reviewed, please see EMR for updated orders. Please see full H&P dictated by admitting physician Dr. Thomes Dinning for same date of service.   Shon Hale, MD

## 2020-12-14 NOTE — Plan of Care (Signed)

## 2020-12-14 NOTE — H&P (Signed)
History and Physical  Mark Solomon:[id number] DOB: 02/04/1940 DOA: 12/13/2020  Referring physician: Mancel Bale, MD PCP: [facility], [facility]  Patient coming from: [facility]  Chief Complaint: Altered mental status  HPI: Mark Solomon is a 80 y.o. male with medical history significant for COPD, essential hypertension, GERD, mixed hyperlipidemia, BPH, sleep apnea on CPAP who presents to the emergency department from [facility] due to altered mental status.  Patient was unable to provide history possibly due to current altered mental status or underlying dementia, history was provided by ED physician and ED medical record.  Per report, patient's family visited him tonight and was noted to be more lethargic, so the facility activated EMS and patient was sent to the ED for further evaluation and management.  He was reported to have pinpoint puppies on arrival to the ED, it was unknown at this time if this was due to polypharmacy.  ED Course:  In the emergency department, he was hypothermic with a temperature of 93.16F, but other vital signs were within normal range.  Work-up in the ED showed leukopenia, and normocytic anemia, BMP was normal, lactic acid was negative, urinalysis was unimpressive for UTI. CT head without contrast showed generalized atrophy without acute intracranial abnormality Chest x-ray showed no active disease. IV hydration was provided, bair hugger was provided.  Temperature improved to 97.53F, patient was now alert and awake, he was only oriented to person, he was talking, though speech was senseless.  Review of Systems: Unable to perform at this time due to patient's altered mental status  Past Medical History:  Diagnosis Date   Aortic valve sclerosis    Arthritis    COPD (chronic obstructive pulmonary disease) (HCC)    Diastolic dysfunction    Hyperlipidemia    Hypertension    Kidney stone    Sleep apnea    Past Surgical History:  Procedure Laterality  Date   CARPAL TUNNEL RELEASE Bilateral    CATARACT EXTRACTION Bilateral    COLONOSCOPY W/ BIOPSIES AND POLYPECTOMY     ESOPHAGEAL DILATION N/A 01/07/2018   Procedure: ESOPHAGEAL DILATION;  Surgeon: Malissa Hippo, MD;  Location: [facility];  Service: Endoscopy;  Laterality: N/A;   ESOPHAGOGASTRODUODENOSCOPY (EGD) WITH PROPOFOL N/A 01/07/2018   Procedure: ESOPHAGOGASTRODUODENOSCOPY (EGD) WITH PROPOFOL;  Surgeon: Malissa Hippo, MD;  Location: [facility];  Service: Endoscopy;  Laterality: N/A;  10:15   HAMMER TOE SURGERY     JOINT REPLACEMENT     KNEE ARTHROSCOPY W/ MENISCAL REPAIR     right    MULTIPLE TOOTH EXTRACTIONS     NM MYOCAR PERF WALL MOTION  11/14/2008   Normal   REPLACEMENT TOTAL KNEE  1994   left   ROTATOR CUFF REPAIR Bilateral approx 20 years   [facility]    TOTAL KNEE ARTHROPLASTY Right 02/08/2015   Procedure: TOTAL KNEE ARTHROPLASTY;  Surgeon: Mark Hamman, MD;  Location: [facility] OR;  Service: Orthopedics;  Laterality: Right;   US ECHOCARDIOGRAPHY  01/23/10   AOV appears sclerotic, borderline LA enlargement    Social History:  reports that he quit smoking about 9 years ago. His smoking use included cigarettes. He has a 50.00 pack-year smoking history. He has never used smokeless tobacco. He reports current alcohol use of about 14.0 standard drinks per week. He reports that he does not use drugs.   Allergies  Allergen Reactions   Aricept [Donepezil] Diarrhea    Family History  Problem Relation Age of Onset   Diabetes Mother  Heart attack Mother    Heart attack Father      Prior to Admission medications   Medication Sig Start Date End Date Taking? Authorizing Provider  folic acid (FOLVITE) 1 MG tablet Take 1 mg by mouth daily.   Yes [provider]  memantine (NAMENDA) 10 MG tablet TAKE 1 TABLET BY MOUTH TWICE DAILY Patient taking differently: Take 10 mg by mouth daily. 11/25/20  Yes Butch Penny, NP  Multiple Vitamins-Minerals  (MULTIVITAMIN WITH MINERALS) tablet Take 1 tablet by mouth at bedtime.    Yes [provider]  pantoprazole (PROTONIX) 20 MG tablet Take 20 mg by mouth daily.   Yes [provider]  risperiDONE (RISPERDAL) 1 MG tablet Take 1 mg by mouth 2 (two) times daily.   Yes [provider]  simvastatin (ZOCOR) 40 MG tablet Take 40 mg by mouth at bedtime.    Yes [provider]  tamsulosin (FLOMAX) 0.4 MG CAPS capsule Take 0.4 mg by mouth at bedtime.    Yes [provider]  thiamine 100 MG tablet Take 100 mg by mouth daily.   Yes [provider]  beclomethasone (QVAR) 40 MCG/ACT inhaler Inhale 2 puffs into the lungs daily.  Patient not taking: Reported on 12/13/2020    [provider]  docusate sodium (COLACE) 50 MG capsule Take 50 mg by mouth daily as needed for mild constipation. Patient not taking: Reported on 12/13/2020    [provider]  fluticasone (FLONASE) 50 MCG/ACT nasal spray Place 2 sprays into both nostrils every morning. Patient not taking: Reported on 12/13/2020    [provider]  ketoconazole (NIZORAL) 2 % shampoo Apply 1 application topically every other day.  Patient not taking: Reported on 12/13/2020    [provider]  losartan (COZAAR) 50 MG tablet Take 50 mg by mouth at bedtime.  Patient not taking: Reported on 12/13/2020    [provider]  naproxen sodium (ALEVE) 220 MG tablet Take 440 mg by mouth at bedtime as needed (pain). Patient not taking: Reported on 12/13/2020    [provider]    Physical Exam: BP (!) 114/57 (BP Location: Left Arm)   Pulse 77   Temp (!) 97.5 F (36.4 C) (Oral)   Resp 14   Ht 5\' 10"  (1.778 m)   Wt 69.1 kg   SpO2 100%   BMI 21.86 kg/m   General: 80 y.o. year-old male well developed, ill-appearing in no acute distress.  Alert and awake, but only oriented x 1 (person). HEENT: NCAT, EOMI Neck: Supple, trachea medial Cardiovascular: Regular rate  and rhythm with no rubs or gallops.  No thyromegaly or JVD noted.  No lower extremity edema. 2/4 pulses in all 4 extremities. Respiratory: Clear to auscultation with no wheezes or rales. Good inspiratory effort. Abdomen: Soft, nontender nondistended with normal bowel sounds x4 quadrants. Muskuloskeletal: No cyanosis, clubbing or edema noted bilaterally Neuro: Moving all extremities, sensation, reflexes intact Skin: No ulcerative lesions noted or rashes Psychiatry: Mood is appropriate for condition and setting          Labs on Admission:  Basic Metabolic Panel: Recent Labs  Lab 12/13/20 1911  NA 144  K 4.3  CL 107  CO2 30  GLUCOSE 104*  BUN 22  CREATININE 0.75  CALCIUM 9.2   Liver Function Tests: Recent Labs  Lab 12/13/20 1911  AST 19  ALT 16  ALKPHOS 111  BILITOT 0.1*  PROT 6.2*  ALBUMIN 3.6   No results for  input(s): LIPASE, AMYLASE in the last 168 hours. No results for input(s): AMMONIA in the last 168 hours. CBC: Recent Labs  Lab 12/13/20 1911  WBC 3.0*  NEUTROABS 1.9  HGB 11.7*  HCT 36.9*  MCV 95.8  PLT 123*   Cardiac Enzymes: No results for input(s): CKTOTAL, CKMB, CKMBINDEX, TROPONINI in the last 168 hours.  BNP (last 3 results) No results for input(s): BNP in the last 8760 hours.  ProBNP (last 3 results) No results for input(s): PROBNP in the last 8760 hours.  CBG: Recent Labs  Lab 12/13/20 1853  GLUCAP 93    Radiological Exams on Admission: CT Head Wo Contrast  Result Date: 12/13/2020 CLINICAL DATA:  Encephalopathy EXAM: CT HEAD WITHOUT CONTRAST TECHNIQUE: Contiguous axial images were obtained from the base of the skull through the vertex without intravenous contrast. COMPARISON:  None. FINDINGS: Brain: There is no mass, hemorrhage or extra-axial collection. The appearance of the white matter is normal for the patient's age. There is generalized atrophy. Vascular: No abnormal hyperdensity of the major intracranial arteries or dural venous  sinuses. No intracranial atherosclerosis. Skull: The visualized skull base, calvarium and extracranial soft tissues are normal. Sinuses/Orbits: No fluid levels or advanced mucosal thickening of the visualized paranasal sinuses. No mastoid or middle ear effusion. The orbits are normal. IMPRESSION: Generalized atrophy without acute intracranial abnormality. Electronically Signed   By: Deatra Robinson M.D.   On: 12/13/2020 20:04   DG Chest Port 1 View  Result Date: 12/13/2020 CLINICAL DATA:  Altered mental status EXAM: PORTABLE CHEST 1 VIEW COMPARISON:  09/04/2020 FINDINGS: The heart size and mediastinal contours are within normal limits. Aortic atherosclerosis. Both lungs are clear. The visualized skeletal structures are unremarkable. Emphysematous disease. Postsurgical changes at the humeral heads. IMPRESSION: No active disease.  Emphysematous disease. Electronically Signed   By: Jasmine Pang M.D.   On: 12/13/2020 19:31    EKG: I independently viewed the EKG done and my findings are as followed: EKG was not done in the ED  Assessment/Plan Present on Admission:  Altered mental status  Principal Problem:   Altered mental status Active Problems:   OSA on CPAP   Hypothermia   Normocytic anemia   COPD (chronic obstructive pulmonary disease) (HCC)   Essential hypertension   GERD (gastroesophageal reflux disease)   Mixed hyperlipidemia   BPH (benign prostatic hyperplasia)  Altered mental status-improved Consult patient's altered mental status currently unknown Urine drug screen pending. Avoid any CNS acting drugs at this time Continue fall precaution and neurochecks  Hypothermia-resolved  Pancytopenia/normocytic anemia Leukopenia and thrombocytopenia appears to be chronic Hemoglobin at 11.7 (last hemoglobin about 2 years ago was 13.8) Continue to monitor CBC with morning labs  Essential hypertension (controlled) Patient was not taking any antihypertensive medication per med  rec  GERD Continue Protonix  Mixed hyperlipidemia Continue Zocor  OSA on CPAP Continue CPAP  Dementia Continue Namenda  Other home medications: Multivitamin Thiamine and folic acid   DVT prophylaxis: Lovenox  Code Status: DNR  Family Communication: None at bedside  Disposition Plan:  Patient is from:                        home Anticipated DC to:                   SNF or family members home Anticipated DC date:               2-3 days Anticipated DC barriers:  Patient requires inpatient management due to altered mental status    Consults called: None  Admission status: Observation    Frankey Shown MD [facility]  12/14/2020, 4:48 AM

## 2020-12-15 ENCOUNTER — Other Ambulatory Visit: Payer: Self-pay

## 2020-12-15 DIAGNOSIS — G9341 Metabolic encephalopathy: Secondary | ICD-10-CM | POA: Diagnosis not present
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
## 2020-12-15 NOTE — Progress Notes (Signed)
PROGRESS NOTE     Mark Solomon, is a 80 y.o. male, DOB - Nov 08, 1940, WNU:[id number]  Admit date - 12/13/2020   Admitting Physician Bianco Cange Mariea Clonts, MD  Outpatient Primary MD for the patient is [facility]  LOS - 1  Chief Complaint  Patient presents with   Altered Mental Status       Brief Summary:- 80 y.o. male with medical history significant for COPD, essential hypertension, GERD, mixed hyperlipidemia, BPH, sleep apnea on CPAP admitted from [facility] SNF on 12/14/2020 with acute metabolic encephalopathy and dehydration and possible UTI requiring IV antibiotics and IV fluids  Assessment & Plan:   Principal Problem:   Altered mental status Active Problems:   OSA on CPAP   Hypothermia   Normocytic anemia   COPD (chronic obstructive pulmonary disease) (HCC)   Essential hypertension   GERD (gastroesophageal reflux disease)   Mixed hyperlipidemia   Pressure injury of skin   Acute metabolic encephalopathy   A/P 1)Acute Metabolic Encephalopathy-- ??  Dehydration and UTI related, - patient supposed to be on CPAP for OSA--get ABG to rule out hypercapnia -UDS unremarkable -Hypothermia has resolved   2)Possible UTI--- IV Rocephin pending culture data   3)Dehydration and HyperNatremia-- C/n D5 half-normal solution as ordered -Oral intake is Not great due to agitation and lack of cooperation from patient   4)OSA--- patient supposed to be on CPAP for OSA--get ABG to rule out hypercapnia -RT to place CPAP nightly   5)Dementia with behavioral disturbance--- continue Namenda  -May use lorazepam as needed   6)Pancytopenia--WBC, Hgb and platelets are not far from baseline   7)H/o HTN--- BP is  soft, avoids BP meds at this time  8)Social/Ethics---Pt is a DNR/DNI,  Spoke with son [name] at [phone]  Disposition/Need for in-Hospital Stay- patient unable to be discharged at this time due to --acute metabolic encephalopathy superimposed on underlying  dementia-moderate secondary to UTI, hyperNatremia and dehydration -Anticipate discharge back to [facility] SNF in 1 to 2 days after urine culture 1 out of work-up results when available--pending clinical improvement  Status is: Inpatient  Remains inpatient appropriate because: see dispo above  Disposition: The patient is from: SNF              Anticipated d/c is to: SNF [facility]              Anticipated d/c date is: 1 day              Patient currently is not medically stable to d/c. Barriers: Not Clinically Stable-   Code Status :  -  Code Status: DNR   Family Communication:   -Spoke with son [name] at [phone] Consults  :  na  DVT Prophylaxis  :   - SCDs  enoxaparin (LOVENOX) injection 40 mg Start: 12/14/20 1000 SCDs Start: 12/14/20 0237    Lab Results  Component Value Date   PLT 119 (L) 12/14/2020    Inpatient Medications  Scheduled Meds:  enoxaparin (LOVENOX) injection  40 mg Subcutaneous Daily   folic acid  1 mg Oral Daily   memantine  10 mg Oral Daily   multivitamin with minerals  1 tablet Oral QHS   pantoprazole  40 mg Oral Daily   simvastatin  40 mg Oral QHS   thiamine  100 mg Oral Daily   Continuous Infusions:  cefTRIAXone (ROCEPHIN)  IV 1 g (12/15/20 1547)   dextrose 5 % and 0.45% NaCl 75 mL/hr at 12/15/20 1328   PRN Meds:.LORazepam  Anti-infectives (From admission, onward)    Start     Dose/Rate Route Frequency Ordered Stop   12/14/20 1630  cefTRIAXone (ROCEPHIN) 1 g in sodium chloride 0.9 % 100 mL IVPB        1 g 200 mL/hr over 30 Minutes Intravenous Every 24 hours 12/14/20 1536           Subjective: [name] today has no fevers, no emesis,  No chest pain,    -Spoke with son [name] at [phone] -Agitation restlessness and combativeness   Objective: Vitals:   12/14/20 1413 12/14/20 1900 12/15/20 0535 12/15/20 1336  BP: 104/74 108/62 (!) 120/96 (!) 105/57  Pulse: 72 80 70 66  Resp: 18 18 19 18   Temp: (!) 97.5 F (36.4 C) 97.6 F  (36.4 C) (!) 97.5 F (36.4 C) 97.9 F (36.6 C)  TempSrc: Oral  Axillary   SpO2: 99% 99% 99% 100%  Weight:      Height:        Intake/Output Summary (Last 24 hours) at 12/15/2020 1754 Last data filed at 12/15/2020 1328 Gross per 24 hour  Intake 1785.19 ml  Output 1000 ml  Net 785.19 ml   Filed Weights   12/14/20 0222  Weight: 69.1 kg     Physical Exam  Gen:- Awake Alert, restless and combative from time to time HEENT:- [place].AT, No sclera icterus Neck-Supple Neck,No JVD,.  Lungs-  CTAB , fair symmetrical air movement CV- S1, S2 normal, regular  Abd-  +ve B.Sounds, Abd Soft, No tenderness,    Extremity/Skin:- No  edema, pedal pulses present  Psych-underlying cognitive and memory deficits with behavioral disturbance  neuro-generalized weakness, no new focal deficits, no tremors  Data Reviewed: I have personally reviewed following labs and imaging studies  CBC: Recent Labs  Lab 12/13/20 1911 12/14/20 0407  WBC 3.0* 3.6*  NEUTROABS 1.9  --   HGB 11.7* 10.5*  HCT 36.9* 34.2*  MCV 95.8 95.5  PLT 123* 119*   Basic Metabolic Panel: Recent Labs  Lab 12/13/20 1911 12/14/20 0407  NA 144 146*  K 4.3 3.6  CL 107 115*  CO2 30 25  GLUCOSE 104* 78  BUN 22 19  CREATININE 0.75 0.67  CALCIUM 9.2 8.5*  MG  --  1.7  PHOS  --  3.0   GFR: Estimated Creatinine Clearance: 74.4 mL/min (by C-G formula based on SCr of 0.67 mg/dL). Liver Function Tests: Recent Labs  Lab 12/13/20 1911 12/14/20 0407  AST 19 17  ALT 16 14  ALKPHOS 111 90  BILITOT 0.1* 0.7  PROT 6.2* 5.0*  ALBUMIN 3.6 2.8*   No results for input(s): LIPASE, AMYLASE in the last 168 hours. No results for input(s): AMMONIA in the last 168 hours. Coagulation Profile: No results for input(s): INR, PROTIME in the last 168 hours. Cardiac Enzymes: No results for input(s): CKTOTAL, CKMB, CKMBINDEX, TROPONINI in the last 168 hours. BNP (last 3 results) No results for input(s): PROBNP in the last 8760  hours. HbA1C: No results for input(s): HGBA1C in the last 72 hours. CBG: Recent Labs  Lab 12/13/20 1853  GLUCAP 93   Lipid Profile: No results for input(s): CHOL, HDL, LDLCALC, TRIG, CHOLHDL, LDLDIRECT in the last 72 hours. Thyroid Function Tests: No results for input(s): TSH, T4TOTAL, FREET4, T3FREE, THYROIDAB in the last 72 hours. Anemia Panel: No results for input(s): VITAMINB12, FOLATE, FERRITIN, TIBC, IRON, RETICCTPCT in the last 72 hours. Urine analysis:    Component Value Date/Time   COLORURINE YELLOW 12/13/2020  0009   APPEARANCEUR HAZY (A) 12/13/2020 0009   LABSPEC >1.030 (H) 12/13/2020 0009   PHURINE 6.0 12/13/2020 0009   GLUCOSEU NEGATIVE 12/13/2020 0009   HGBUR LARGE (A) 12/13/2020 0009   BILIRUBINUR NEGATIVE 12/13/2020 0009   KETONESUR NEGATIVE 12/13/2020 0009   PROTEINUR 30 (A) 12/13/2020 0009   NITRITE NEGATIVE 12/13/2020 0009   LEUKOCYTESUR MODERATE (A) 12/13/2020 0009   Sepsis Labs: @LABRCNTIP (procalcitonin:4,lacticidven:4)  ) Recent Results (from the past 240 hour(s))  Culture, blood (routine x 2)     Status: None (Preliminary result)   Collection Time: 12/13/20  7:12 PM   Specimen: Right Antecubital; Blood  Result Value Ref Range Status   Specimen Description   Final    RIGHT ANTECUBITAL BOTTLES DRAWN AEROBIC AND ANAEROBIC   Special Requests   Final    Blood Culture results may not be optimal due to an excessive volume of blood received in culture bottles   Culture   Final    NO GROWTH 2 DAYS Performed at [facility], [address] [place], [place]    Report Status PENDING  Incomplete  Culture, blood (routine x 2)     Status: None (Preliminary result)   Collection Time: 12/13/20  7:13 PM   Specimen: Left Antecubital; Blood  Result Value Ref Range Status   Specimen Description LEFT ANTECUBITAL  Final   Special Requests   Final    BOTTLES DRAWN AEROBIC AND ANAEROBIC Blood Culture adequate volume   Culture   Final    NO GROWTH 2  DAYS Performed at [facility], [address] [place], [place]    Report Status PENDING  Incomplete  Resp Panel by RT-PCR (Flu A&B, Covid) Nasopharyngeal Swab     Status: None   Collection Time: 12/13/20  8:43 PM   Specimen: Nasopharyngeal Swab; Nasopharyngeal(NP) swabs in vial transport medium  Result Value Ref Range Status   SARS Coronavirus 2 by RT PCR NEGATIVE NEGATIVE Final    Comment: (NOTE) SARS-CoV-2 target nucleic acids are NOT DETECTED.  The SARS-CoV-2 RNA is generally detectable in upper respiratory specimens during the acute phase of infection. The lowest concentration of SARS-CoV-2 viral copies this assay can detect is 138 copies/mL. A negative result does not preclude SARS-Cov-2 infection and should not be used as the sole basis for treatment or other patient management decisions. A negative result may occur with  improper specimen collection/handling, submission of specimen other than nasopharyngeal swab, presence of viral mutation(s) within the areas targeted by this assay, and inadequate number of viral copies(<138 copies/mL). A negative result must be combined with clinical observations, patient history, and epidemiological information. The expected result is Negative.  Fact Sheet for Patients:  14/02/22  Fact Sheet for Healthcare Providers:  BloggerCourse.com  This test is no t yet approved or cleared by the [place] FDA and  has been authorized for detection and/or diagnosis of SARS-CoV-2 by FDA under an Emergency Use Authorization (EUA). This EUA will remain  in effect (meaning this test can be used) for the duration of the COVID-19 declaration under Section 564(b)(1) of the Act, 21 U.S.C.section 360bbb-3(b)(1), unless the authorization is terminated  or revoked sooner.       Influenza A by PCR NEGATIVE NEGATIVE Final   Influenza B by PCR NEGATIVE NEGATIVE Final    Comment:  (NOTE) The Xpert Xpress SARS-CoV-2/FLU/RSV plus assay is intended as an aid in the diagnosis of influenza from Nasopharyngeal swab specimens and should not be used as a  sole basis for treatment. Nasal washings and aspirates are unacceptable for Xpert Xpress SARS-CoV-2/FLU/RSV testing.  Fact Sheet for Patients: BloggerCourse.com  Fact Sheet for Healthcare Providers: SeriousBroker.it  This test is not yet approved or cleared by the [place] FDA and has been authorized for detection and/or diagnosis of SARS-CoV-2 by FDA under an Emergency Use Authorization (EUA). This EUA will remain in effect (meaning this test can be used) for the duration of the COVID-19 declaration under Section 564(b)(1) of the Act, 21 U.S.C. section 360bbb-3(b)(1), unless the authorization is terminated or revoked.  Performed at [facility], [address], [place], [place] [address]       Radiology Studies: CT Head Wo Contrast  Result Date: 12/13/2020 CLINICAL DATA:  Encephalopathy EXAM: CT HEAD WITHOUT CONTRAST TECHNIQUE: Contiguous axial images were obtained from the base of the skull through the vertex without intravenous contrast. COMPARISON:  None. FINDINGS: Brain: There is no mass, hemorrhage or extra-axial collection. The appearance of the white matter is normal for the patient's age. There is generalized atrophy. Vascular: No abnormal hyperdensity of the major intracranial arteries or dural venous sinuses. No intracranial atherosclerosis. Skull: The visualized skull base, calvarium and extracranial soft tissues are normal. Sinuses/Orbits: No fluid levels or advanced mucosal thickening of the visualized paranasal sinuses. No mastoid or middle ear effusion. The orbits are normal. IMPRESSION: Generalized atrophy without acute intracranial abnormality. Electronically Signed   By: Deatra Robinson M.D.   On: 12/13/2020 20:04   DG Chest Port 1 View  Result  Date: 12/13/2020 CLINICAL DATA:  Altered mental status EXAM: PORTABLE CHEST 1 VIEW COMPARISON:  09/04/2020 FINDINGS: The heart size and mediastinal contours are within normal limits. Aortic atherosclerosis. Both lungs are clear. The visualized skeletal structures are unremarkable. Emphysematous disease. Postsurgical changes at the humeral heads. IMPRESSION: No active disease.  Emphysematous disease. Electronically Signed   By: Jasmine Pang M.D.   On: 12/13/2020 19:31     Scheduled Meds:  enoxaparin (LOVENOX) injection  40 mg Subcutaneous Daily   folic acid  1 mg Oral Daily   memantine  10 mg Oral Daily   multivitamin with minerals  1 tablet Oral QHS   pantoprazole  40 mg Oral Daily   simvastatin  40 mg Oral QHS   thiamine  100 mg Oral Daily   Continuous Infusions:  cefTRIAXone (ROCEPHIN)  IV 1 g (12/15/20 1547)   dextrose 5 % and 0.45% NaCl 75 mL/hr at 12/15/20 1328     LOS: 1 day    Shon Hale M.D on 12/15/2020 at 5:54 PM  Go to [email or web address] - for contact info  [facility] - Office  [phone]  If 7PM-7AM, please contact night-coverage [email or web address] Password [facility] 12/15/2020, 5:54 PM

## 2020-12-16 DIAGNOSIS — G9341 Metabolic encephalopathy: Secondary | ICD-10-CM | POA: Diagnosis not present

## 2020-12-16 LAB — BASIC METABOLIC PANEL
Anion gap: 6 (ref 5–15)
BUN: 11 mg/dL (ref 8–23)
CO2: 29 mmol/L (ref 22–32)
Calcium: 8.8 mg/dL — ABNORMAL LOW (ref 8.9–10.3)
Chloride: 106 mmol/L (ref 98–111)
Creatinine, Ser: 0.66 mg/dL (ref 0.61–1.24)
GFR, Estimated: >60 mL/min (ref >60)
Glucose, Bld: 89 mg/dL (ref 70–99)
Potassium: 3.9 mmol/L (ref 3.5–5.1)
Sodium: 141 mmol/L (ref 135–145)

## 2020-12-16 LAB — CBC
HCT: 34.5 % — ABNORMAL LOW (ref 39.0–52.0)
Hemoglobin: 11.2 g/dL — ABNORMAL LOW (ref 13.0–17.0)
MCH: 29.7 pg (ref 26.0–34.0)
MCHC: 32.5 g/dL (ref 30.0–36.0)
MCV: 91.5 fL (ref 80.0–100.0)
Platelets: 118 10*3/uL — ABNORMAL LOW (ref 150–400)
RBC: 3.77 MIL/uL — ABNORMAL LOW (ref 4.22–5.81)
RDW: 14.1 % (ref 11.5–15.5)
WBC: 3.2 10*3/uL — ABNORMAL LOW (ref 4.0–10.5)
nRBC: 0 % (ref 0.0–0.2)

## 2020-12-16 NOTE — NC FL2 (Signed)
[place] MEDICAID FL2 LEVEL OF CARE SCREENING TOOL     IDENTIFICATION  Patient Name: Mark Solomon Birthdate: 12-Mar-1940 Sex: male Admission Date (Current Location): 12/13/2020  [facility] and [place] Number:  [employer] and Address:  [facility],  [address], [place] [address]      Provider Number: [id number]  Attending Physician Name and Address:  Shon Hale, MD  Relative Name and Phone Number:       Current Level of Care: Hospital Recommended Level of Care: Skilled Nursing Facility Prior Approval Number:    Date Approved/Denied:   PASRR Number:    Discharge Plan: SNF    Current Diagnoses: Patient Active Problem List   Diagnosis Date Noted   Altered mental status 12/14/2020   Hypothermia 12/14/2020   Normocytic anemia 12/14/2020   COPD (chronic obstructive pulmonary disease) (HCC) 12/14/2020   Essential hypertension 12/14/2020   GERD (gastroesophageal reflux disease) 12/14/2020   Mixed hyperlipidemia 12/14/2020   BPH (benign prostatic hyperplasia) 12/14/2020   Pressure injury of skin 12/14/2020   Acute metabolic encephalopathy 12/14/2020   Subdural hematoma due to concussion 02/07/2019   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, 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   Cognitive decline 02/07/2019   Portal hypertensive gastropathy (HCC) 02/07/2019   Thrombocytopenia (HCC) 02/07/2019   Esophageal dysphagia 12/27/2017   OSA on CPAP 09/15/2017   Abnormal dreams 09/15/2017   Obstructive sleep apnea hypopnea, severe 09/15/2017   Cachexia (HCC) 09/15/2017   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   Snoring 09/15/2017   Primary localized osteoarthritis of right knee 02/08/2015    Orientation RESPIRATION BLADDER Height & Weight     Self  Normal Incontinent Weight: 152 lb 5.4 oz (69.1 kg) Height:  5\' 10"  (177.8 cm)  BEHAVIORAL SYMPTOMS/MOOD NEUROLOGICAL BOWEL NUTRITION STATUS      Incontinent Diet (see dc summary)  AMBULATORY  STATUS COMMUNICATION OF NEEDS Skin   Limited Assist Verbally PU Stage and Appropriate Care   PU Stage 2 Dressing: Daily (Left heel)                   Personal Care Assistance Level of Assistance  Bathing, Feeding, Dressing Bathing Assistance: Limited assistance Feeding assistance: Independent Dressing Assistance: Limited assistance     Functional Limitations Info  Sight, Hearing, Speech Sight Info: Adequate Hearing Info: Adequate Speech Info: Adequate    SPECIAL CARE FACTORS FREQUENCY                       Contractures Contractures Info: Not present    Additional Factors Info  Code Status, Allergies Code Status Info: DNR Allergies Info: Aricept           Current Medications (12/16/2020):  This is the current hospital active medication list Current Facility-Administered Medications  Medication Dose Route Frequency Provider Last Rate Last Admin   cefTRIAXone (ROCEPHIN) 1 g in sodium chloride 0.9 % 100 mL IVPB  1 g Intravenous Q24H Emokpae, Courage, MD 200 mL/hr at 12/15/20 1547 1 g at 12/15/20 1547   dextrose 5 %-0.45 % sodium chloride infusion   Intravenous Continuous Emokpae, Courage, MD 75 mL/hr at 12/16/20 0929 New Bag at 12/16/20 0929   enoxaparin (LOVENOX) injection 40 mg  40 mg Subcutaneous Daily Adefeso, Oladapo, DO   40 mg at 12/16/20 0949   folic acid (FOLVITE) tablet 1 mg  1 mg Oral Daily Adefeso, Oladapo, DO   1 mg at 12/16/20 [phone]  LORazepam (ATIVAN) injection 1 mg  1 mg Intravenous Q12H PRN Emokpae, Courage, MD   1 mg at 12/15/20 1505   memantine (NAMENDA) tablet 10 mg  10 mg Oral Daily Adefeso, Oladapo, DO   10 mg at 12/16/20 0829   multivitamin with minerals tablet 1 tablet  1 tablet Oral QHS Adefeso, Oladapo, DO   1 tablet at 12/15/20 2211   pantoprazole (PROTONIX) EC tablet 40 mg  40 mg Oral Daily Emokpae, Courage, MD   40 mg at 12/16/20 0829   simvastatin (ZOCOR) tablet 40 mg  40 mg Oral QHS Adefeso, Oladapo, DO   40 mg at 12/15/20 2210    thiamine tablet 100 mg  100 mg Oral Daily Adefeso, Oladapo, DO   100 mg at 12/16/20 4656     Discharge Medications: Please see discharge summary for a list of discharge medications.  Relevant Imaging Results:  Relevant Lab Results:   Additional Information    Elliot Gault, LCSW

## 2020-12-16 NOTE — TOC Initial Note (Signed)
Transition of Care [facility]) - Initial/Assessment Note    Patient Details  Name: Mark Solomon MRN: [id number] Date of Birth: 1940-09-09  [facility]) CM/SW Contact:    Elliot Gault, LCSW Phone Number: 12/16/2020, 10:52 AM  Clinical Narrative:                  Pt admitted from long term care at [facility]. Plan is for return to [facility] at dc. MD anticipating possible dc tomorrow.Spoke with Eunice Blase at [facility] today to update.   TOC will follow.  Expected Discharge Plan: Long Term Nursing Home Barriers to Discharge: Continued Medical Work up   Patient Goals and CMS Choice        Expected Discharge Plan and Services Expected Discharge Plan: Long Term Nursing Home In-house Referral: Clinical Social Work   Post Acute Care Choice: Resumption of Svcs/PTA Provider Living arrangements for the past 2 months: Skilled Nursing Facility                                      Prior Living Arrangements/Services Living arrangements for the past 2 months: Skilled Nursing Facility Lives with:: Facility Resident Patient language and need for interpreter reviewed:: Yes Do you feel safe going back to the place where you live?: Yes      Need for Family Participation in Patient Care: No (Comment) Care giver support system in place?: Yes (comment)   Criminal Activity/Legal Involvement Pertinent to Current Situation/Hospitalization: No - Comment as needed  Activities of Daily Living   ADL Screening (condition at time of admission) Patient's cognitive ability adequate to safely complete daily activities?: Yes Patient able to express need for assistance with ADLs?: No  Permission Sought/Granted                  Emotional Assessment       Orientation: : Oriented to Self Alcohol / Substance Use: Not Applicable Psych Involvement: No (comment)  Admission diagnosis:  Altered mental status [R41.82] Hypothermia, initial encounter [T68.XXXA] Altered mental status,  unspecified altered mental status type [R41.82] Acute metabolic encephalopathy [G93.41] Patient Active Problem List   Diagnosis Date Noted   Altered mental status 12/14/2020   Hypothermia 12/14/2020   Normocytic anemia 12/14/2020   COPD (chronic obstructive pulmonary disease) (HCC) 12/14/2020   Essential hypertension 12/14/2020   GERD (gastroesophageal reflux disease) 12/14/2020   Mixed hyperlipidemia 12/14/2020   BPH (benign prostatic hyperplasia) 12/14/2020   Pressure injury of skin 12/14/2020   Acute metabolic encephalopathy 12/14/2020   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 - [place], [place] - [address] [place] [address] Phone: [phone] Fax: [phone]     Social Determinants of Health (SDOH) Interventions    Readmission Risk Interventions Readmission Risk Prevention Plan 12/16/2020  Medication Screening Complete  Transportation Screening Complete  Some recent data might  be hidden

## 2020-12-16 NOTE — Progress Notes (Signed)
PROGRESS NOTE     Mark Solomon, is a 80 y.o. male, DOB - 1940-10-10, WYO:[id number]  Admit date - 12/13/2020   Admitting Physician Mark Solomon Mark Clonts, MD  Outpatient Primary MD for the patient is [facility]  LOS - 2  Chief Complaint  Patient presents with   Altered Mental Status       Brief Summary:- 80 y.o. male with medical history significant for COPD, essential hypertension, GERD, mixed hyperlipidemia, BPH, sleep apnea on CPAP admitted from [facility] on 12/14/2020 with acute metabolic encephalopathy and dehydration and possible UTI requiring IV antibiotics and IV fluids  Assessment & Plan:   Principal Problem:   Acute metabolic encephalopathy Active Problems:   OSA on CPAP   Hypothermia   Normocytic anemia   COPD (chronic obstructive pulmonary disease) (HCC)   Essential hypertension   GERD (gastroesophageal reflux disease)   Mixed hyperlipidemia   Pressure injury of skin   A/P 1)Acute Metabolic Encephalopathy-- ??  Dehydration and UTI related, - patient supposed to be on CPAP for OSA--episodes of confusion and agitation persist from time to time  ABG w/o hypercapnia -UDS unremarkable -Hypothermia has resolved   2)Possible UTI---c/n  IV Rocephin pending culture data   3)Dehydration and HyperNatremia--sodium is normalized with hydration C/n D5 half-normal solution as ordered -Oral intake is Not great due to agitation and lack of cooperation from patient   4)OSA--- patient supposed to be on CPAP for OSA--get ABG to rule out hypercapnia -RT to place CPAP nightly   5)Dementia with behavioral disturbance--- continue Namenda  -May use lorazepam as needed   6)Pancytopenia--WBC, Hgb and platelets are not far from baseline   7)H/o HTN--- BP is  soft, avoids BP meds at this time  8)Social/Ethics---Pt is a DNR/DNI,  Spoke with son Mark Solomon at [phone]  Disposition/Need for in-Hospital Stay- patient unable to be discharged at this time due to  --acute metabolic encephalopathy superimposed on underlying dementia-moderate secondary to UTI, hyperNatremia and dehydration -Anticipate discharge back to [facility] in 1 to 2 days after urine culture 1 out of work-up results when available--pending clinical improvement  Status is: Inpatient  Remains inpatient appropriate because: see dispo above  Disposition: The patient is from: SNF              Anticipated d/c is to: SNF [facility]              Anticipated d/c date is: 1 day              Patient currently is not medically stable to d/c. Barriers: Not Clinically Stable-   Code Status :  -  Code Status: DNR   Family Communication:   -Spoke with son Mark Solomon at [phone] Consults  :  na  DVT Prophylaxis  :   - SCDs  enoxaparin (LOVENOX) injection 40 mg Start: 12/14/20 1000 SCDs Start: 12/14/20 0237    Lab Results  Component Value Date   PLT 118 (L) 12/16/2020    Inpatient Medications  Scheduled Meds:  enoxaparin (LOVENOX) injection  40 mg Subcutaneous Daily   folic acid  1 mg Oral Daily   memantine  10 mg Oral Daily   multivitamin with minerals  1 tablet Oral QHS   pantoprazole  40 mg Oral Daily   simvastatin  40 mg Oral QHS   thiamine  100 mg Oral Daily   Continuous Infusions:  cefTRIAXone (ROCEPHIN)  IV 1 g (12/16/20 1621)   dextrose 5 % and 0.45% NaCl 75 mL/hr at  12/16/20 0929   PRN Meds:.LORazepam   Anti-infectives (From admission, onward)    Start     Dose/Rate Route Frequency Ordered Stop   12/14/20 1630  cefTRIAXone (ROCEPHIN) 1 g in sodium chloride 0.9 % 100 mL IVPB        1 g 200 mL/hr over 30 Minutes Intravenous Every 24 hours 12/14/20 1536           Subjective: Mark Solomon today has no fevers, no emesis,  No chest pain,   Episodes of confusion and agitation persist from time to time  -Oral intake is fair -   Objective: Vitals:   12/15/20 1336 12/15/20 2059 12/16/20 0444 12/16/20 1201  BP: (!) 105/57 (!) 141/62 (!) 144/68 (!) 103/51   Pulse: 66 73 72 69  Resp: Temp: 97.9 F (36.6 C) 97.7 F (36.5 C) (!) 97.3 F (36.3 C) 98 F (36.7 C)  TempSrc:    Axillary  SpO2: 100% 100% 100% 99%  Weight:      Height:        Intake/Output Summary (Last 24 hours) at 12/16/2020 1656 Last data filed at 12/16/2020 1300 Gross per 24 hour  Intake 1520 ml  Output 2400 ml  Net -880 ml   Filed Weights   12/14/20 0222  Weight: 69.1 kg     Physical Exam  Gen:- Awake Alert, restless and combative from time to time HEENT:- [place].AT, No sclera icterus Neck-Supple Neck,No JVD,.  Lungs-  CTAB , fair symmetrical air movement CV- S1, S2 normal, regular  Abd-  +ve B.Sounds, Abd Soft, No tenderness,    Extremity/Skin:- No  edema, pedal pulses present  Psych-underlying cognitive and memory deficits with behavioral disturbance  neuro-generalized weakness, no new focal deficits, no tremors  Data Reviewed: I have personally reviewed following labs and imaging studies  CBC: Recent Labs  Lab 12/13/20 1911 12/14/20 0407 12/16/20 0421  WBC 3.0* 3.6* 3.2*  NEUTROABS 1.9  --   --   HGB 11.7* 10.5* 11.2*  HCT 36.9* 34.2* 34.5*  MCV 95.8 95.5 91.5  PLT 123* 119* 118*   Basic Metabolic Panel: Recent Labs  Lab 12/13/20 1911 12/14/20 0407 12/16/20 0421  NA 144 146* 141  K 4.3 3.6 3.9  CL 107 115* 106  CO2 GLUCOSE 104* 78 89  BUN CREATININE 0.75 0.67 0.66  CALCIUM 9.2 8.5* 8.8*  MG  --  1.7  --   PHOS  --  3.0  --    GFR: Estimated Creatinine Clearance: 74.4 mL/min (by C-G formula based on SCr of 0.66 mg/dL). Liver Function Tests: Recent Labs  Lab 12/13/20 1911 12/14/20 0407  AST 19 17  ALT 16 14  ALKPHOS 111 90  BILITOT 0.1* 0.7  PROT 6.2* 5.0*  ALBUMIN 3.6 2.8*   No results for input(s): LIPASE, AMYLASE in the last 168 hours. No results for input(s): AMMONIA in the last 168 hours. Coagulation Profile: No results for input(s): INR, PROTIME in the last 168 hours. Cardiac  Enzymes: No results for input(s): CKTOTAL, CKMB, CKMBINDEX, TROPONINI in the last 168 hours. BNP (last 3 results) No results for input(s): PROBNP in the last 8760 hours. HbA1C: No results for input(s): HGBA1C in the last 72 hours. CBG: Recent Labs  Lab 12/13/20 1853  GLUCAP 93   Lipid Profile: No results for input(s): CHOL, HDL, LDLCALC, TRIG, CHOLHDL, LDLDIRECT in the last 72 hours. Thyroid Function Tests: No results for input(s):  TSH, T4TOTAL, FREET4, T3FREE, THYROIDAB in the last 72 hours. Anemia Panel: No results for input(s): VITAMINB12, FOLATE, FERRITIN, TIBC, IRON, RETICCTPCT in the last 72 hours. Urine analysis:    Component Value Date/Time   COLORURINE YELLOW 12/13/2020 0009   APPEARANCEUR HAZY (A) 12/13/2020 0009   LABSPEC >1.030 (H) 12/13/2020 0009   PHURINE 6.0 12/13/2020 0009   GLUCOSEU NEGATIVE 12/13/2020 0009   HGBUR LARGE (A) 12/13/2020 0009   BILIRUBINUR NEGATIVE 12/13/2020 0009   KETONESUR NEGATIVE 12/13/2020 0009   PROTEINUR 30 (A) 12/13/2020 0009   NITRITE NEGATIVE 12/13/2020 0009   LEUKOCYTESUR MODERATE (A) 12/13/2020 0009   Sepsis Labs: @LABRCNTIP (procalcitonin:4,lacticidven:4)  ) Recent Results (from the past 240 hour(s))  Culture, blood (routine x 2)     Status: None (Preliminary result)   Collection Time: 12/13/20  7:12 PM   Specimen: Right Antecubital; Blood  Result Value Ref Range Status   Specimen Description   Final    RIGHT ANTECUBITAL BOTTLES DRAWN AEROBIC AND ANAEROBIC   Special Requests   Final    Blood Culture results may not be optimal due to an excessive volume of blood received in culture bottles   Culture   Final    NO GROWTH 3 DAYS Performed at [facility], [address] [place], [place]    Report Status PENDING  Incomplete  Culture, blood (routine x 2)     Status: None (Preliminary result)   Collection Time: 12/13/20  7:13 PM   Specimen: Left Antecubital; Blood  Result Value Ref Range Status   Specimen  Description LEFT ANTECUBITAL  Final   Special Requests   Final    BOTTLES DRAWN AEROBIC AND ANAEROBIC Blood Culture adequate volume   Culture   Final    NO GROWTH 3 DAYS Performed at [facility], [address], [place], [place]    Report Status PENDING  Incomplete  Resp Panel by RT-PCR (Flu A&B, Covid) Nasopharyngeal Swab     Status: None   Collection Time: 12/13/20  8:43 PM   Specimen: Nasopharyngeal Swab; Nasopharyngeal(NP) swabs in vial transport medium  Result Value Ref Range Status   SARS Coronavirus 2 by RT PCR NEGATIVE NEGATIVE Final    Comment: (NOTE) SARS-CoV-2 target nucleic acids are NOT DETECTED.  The SARS-CoV-2 RNA is generally detectable in upper respiratory specimens during the acute phase of infection. The lowest concentration of SARS-CoV-2 viral copies this assay can detect is 138 copies/mL. A negative result does not preclude SARS-Cov-2 infection and should not be used as the sole basis for treatment or other patient management decisions. A negative result may occur with  improper specimen collection/handling, submission of specimen other than nasopharyngeal swab, presence of viral mutation(s) within the areas targeted by this assay, and inadequate number of viral copies(<138 copies/mL). A negative result must be combined with clinical observations, patient history, and epidemiological information. The expected result is Negative.  Fact Sheet for Patients:  14/02/22  Fact Sheet for Healthcare Providers:  BloggerCourse.com  This test is no t yet approved or cleared by the [place] FDA and  has been authorized for detection and/or diagnosis of SARS-CoV-2 by FDA under an Emergency Use Authorization (EUA). This EUA will remain  in effect (meaning this test can be used) for the duration of the COVID-19 declaration under Section 564(b)(1) of the Act, 21 U.S.C.section 360bbb-3(b)(1), unless the  authorization is terminated  or revoked sooner.       Influenza A by PCR NEGATIVE NEGATIVE Final  Influenza B by PCR NEGATIVE NEGATIVE Final    Comment: (NOTE) The Xpert Xpress SARS-CoV-2/FLU/RSV plus assay is intended as an aid in the diagnosis of influenza from Nasopharyngeal swab specimens and should not be used as a sole basis for treatment. Nasal washings and aspirates are unacceptable for Xpert Xpress SARS-CoV-2/FLU/RSV testing.  Fact Sheet for Patients: BloggerCourse.com  Fact Sheet for Healthcare Providers: SeriousBroker.it  This test is not yet approved or cleared by the [place] FDA and has been authorized for detection and/or diagnosis of SARS-CoV-2 by FDA under an Emergency Use Authorization (EUA). This EUA will remain in effect (meaning this test can be used) for the duration of the COVID-19 declaration under Section 564(b)(1) of the Act, 21 U.S.C. section 360bbb-3(b)(1), unless the authorization is terminated or revoked.  Performed at [facility], [address], [place], [place] [address]   Urine Culture     Status: None (Preliminary result)   Collection Time: 12/14/20 12:40 PM   Specimen: In/Out Cath Urine  Result Value Ref Range Status   Specimen Description   Final    IN/OUT CATH URINE Performed at [facility], [address] [place], [place] [address]    Special Requests   Final    NONE Performed at [facility], [address] [place], [place] [address]    Culture   Final    CULTURE REINCUBATED FOR BETTER GROWTH Performed at [facility], [address], [place], [place] [address]    Report Status PENDING  Incomplete      Radiology Studies: No results found.   Scheduled Meds:  enoxaparin (LOVENOX) injection  40 mg Subcutaneous Daily   folic acid  1 mg Oral Daily   memantine  10 mg Oral Daily   multivitamin with minerals  1 tablet Oral QHS   pantoprazole  40 mg Oral Daily    simvastatin  40 mg Oral QHS   thiamine  100 mg Oral Daily   Continuous Infusions:  cefTRIAXone (ROCEPHIN)  IV 1 g (12/16/20 1621)   dextrose 5 % and 0.45% NaCl 75 mL/hr at 12/16/20 0929     LOS: 2 days    Shon Hale M.D on 12/16/2020 at 4:56 PM  Go to [email or web address] - for contact info  [facility] - Office  [phone]  If 7PM-7AM, please contact night-coverage [email or web address] Password [facility] 12/16/2020, 4:56 PM

## 2020-12-17 DIAGNOSIS — G9341 Metabolic encephalopathy: Secondary | ICD-10-CM | POA: Diagnosis not present

## 2020-12-17 LAB — CBC
HCT: 36.4 % — ABNORMAL LOW (ref 39.0–52.0)
Hemoglobin: 11.6 g/dL — ABNORMAL LOW (ref 13.0–17.0)
MCH: 29.3 pg (ref 26.0–34.0)
MCHC: 31.9 g/dL (ref 30.0–36.0)
MCV: 91.9 fL (ref 80.0–100.0)
Platelets: 122 10*3/uL — ABNORMAL LOW (ref 150–400)
RBC: 3.96 MIL/uL — ABNORMAL LOW (ref 4.22–5.81)
RDW: 14.2 % (ref 11.5–15.5)
WBC: 3.2 10*3/uL — ABNORMAL LOW (ref 4.0–10.5)
nRBC: 0 % (ref 0.0–0.2)

## 2020-12-17 LAB — URINE CULTURE: Culture: 20000 — AB

## 2020-12-17 MED ORDER — PANTOPRAZOLE SODIUM 40 MG PO TBEC: 40.0000 mg | DELAYED_RELEASE_TABLET | Freq: Every day | ORAL | 1 refills | Status: AC

## 2020-12-17 MED ORDER — LORAZEPAM 0.5 MG PO TABS: 0.5000 mg | ORAL_TABLET | Freq: Two times a day (BID) | ORAL | 0 refills | Status: AC | PRN

## 2020-12-17 MED ORDER — SENNOSIDES-DOCUSATE SODIUM 8.6-50 MG PO TABS
2.0000 | ORAL_TABLET | Freq: Every day | ORAL | 11 refills | Status: AC
Start: 2020-12-17 — End: 2021-12-17

## 2020-12-17 MED ORDER — SODIUM CHLORIDE 0.9 % IV SOLN
1.0000 g | Freq: Once | INTRAVENOUS | Status: AC
  Administered 2020-12-17: 1 g via INTRAVENOUS
  Filled 2020-12-17: qty 10

## 2020-12-17 NOTE — TOC Transition Note (Signed)
Transition of Care [facility]) - CM/SW Discharge Note   Patient Details  Name: Mark Solomon MRN: [id number] Date of Birth: Feb 06, 1940  Transition of Care [facility]) CM/SW Contact:  Elliot Gault, LCSW Phone Number: 12/17/2020, 3:02 PM   Clinical Narrative:     Pt stable to dc back to [facility] today per MD. Eunice Blase at [facility] states that they are prepared for pt today. Updated pt's wife who is in agreement with the dc plan.   DC clinical sent electronically. RN to call report. EMS to transport.  There are no other TOC needs for dc.  Final next level of care: Skilled Nursing Facility Barriers to Discharge: Barriers Resolved   Patient Goals and CMS Choice        Discharge Placement                       Discharge Plan and Services In-house Referral: Clinical Social Work   Post Acute Care Choice: Resumption of Svcs/PTA Provider                               Social Determinants of Health (SDOH) Interventions     Readmission Risk Interventions Readmission Risk Prevention Plan 12/16/2020  Medication Screening Complete  Transportation Screening Complete  Some recent data might be hidden

## 2020-12-17 NOTE — Discharge Summary (Signed)
Mark Solomon, is a 80 y.o. male  DOB May 22, 1940  MRN [id number].  Admission date:  12/13/2020  Admitting Physician  Shon Hale, MD  Discharge Date:  12/17/2020   Primary MD  [facility]  Recommendations for primary care physician for things to follow:      Admission Diagnosis  Altered mental status [R41.82] Hypothermia, initial encounter [T68.XXXA] Altered mental status, unspecified altered mental status type [R41.82] Acute metabolic encephalopathy [G93.41]   Discharge Diagnosis  Altered mental status [R41.82] Hypothermia, initial encounter [T68.XXXA] Altered mental status, unspecified altered mental status type [R41.82] Acute metabolic encephalopathy [G93.41]    Principal Problem:   Acute metabolic encephalopathy Active Problems:   OSA on CPAP   Hypothermia   Normocytic anemia   COPD (chronic obstructive pulmonary disease) (HCC)   Essential hypertension   GERD (gastroesophageal reflux disease)   Mixed hyperlipidemia   Pressure injury of skin      Past Medical History:  Diagnosis Date   Aortic valve sclerosis    Arthritis    COPD (chronic obstructive pulmonary disease) (HCC)    Diastolic dysfunction    Hyperlipidemia    Hypertension    Kidney stone    Sleep apnea     Past Surgical History:  Procedure Laterality Date   CARPAL TUNNEL RELEASE Bilateral    CATARACT EXTRACTION Bilateral    COLONOSCOPY W/ BIOPSIES AND POLYPECTOMY     ESOPHAGEAL DILATION N/A 01/07/2018   Procedure: ESOPHAGEAL DILATION;  Surgeon: Mark Hippo, MD;  [facility];  Service: Endoscopy;  Laterality: N/A;   ESOPHAGOGASTRODUODENOSCOPY (EGD) WITH PROPOFOL N/A 01/07/2018   Procedure: ESOPHAGOGASTRODUODENOSCOPY (EGD) WITH PROPOFOL;  Surgeon: Mark Hippo, MD;  [facility];  Service: Endoscopy;  Laterality: N/A;  10:15   HAMMER TOE SURGERY     JOINT  REPLACEMENT     KNEE ARTHROSCOPY W/ MENISCAL REPAIR     right    MULTIPLE TOOTH EXTRACTIONS     NM MYOCAR PERF WALL MOTION  11/14/2008   Normal   REPLACEMENT TOTAL KNEE  1994   left   ROTATOR CUFF REPAIR Bilateral approx 20 years   Geoffery Spruce    TOTAL KNEE ARTHROPLASTY Right 02/08/2015   Procedure: TOTAL KNEE ARTHROPLASTY;  Surgeon: Mark Hamman, MD;  Location: [facility];  Service: Orthopedics;  Laterality: Right;   US ECHOCARDIOGRAPHY  01/23/10   AOV appears sclerotic, borderline LA enlargement     HPI  from the history and physical done on the day of admission:      Chief Complaint: Altered mental status   HPI: Mark Solomon is a 80 y.o. male with medical history significant for COPD, essential hypertension, GERD, mixed hyperlipidemia, BPH, sleep apnea on CPAP who presents to the emergency department from [facility] due to altered mental status.  Patient was unable to provide history possibly due to current altered mental status or underlying dementia, history was provided by ED physician and ED medical record.  Per report, patient's family visited him tonight and  was noted to be more lethargic, so the facility activated EMS and patient was sent to the ED for further evaluation and management.  He was reported to have pinpoint puppies on arrival to the ED, it was unknown at this time if this was due to polypharmacy.   ED Course:  In the emergency department, he was hypothermic with a temperature of 93.41F, but other vital signs were within normal range.  Work-up in the ED showed leukopenia, and normocytic anemia, BMP was normal, lactic acid was negative, urinalysis was unimpressive for UTI. CT head without contrast showed generalized atrophy without acute intracranial abnormality Chest x-ray showed no active disease. IV hydration was provided, bair hugger was provided.  Temperature improved to 97.18F, patient was now alert and awake, he was only oriented to person, he was talking, though  speech was senseless.     Hospital Course:        Brief Summary:- 80 y.o. male with medical history significant for COPD, essential hypertension, GERD, mixed hyperlipidemia, BPH, sleep apnea on CPAP admitted from [facility] on 12/14/2020 with acute metabolic encephalopathy and dehydration and possible UTI requiring IV antibiotics and IV fluids    Assessment & Plan:   Principal Problem:   Acute metabolic encephalopathy Active Problems:   OSA on CPAP   Hypothermia   Normocytic anemia   COPD (chronic obstructive pulmonary disease) (HCC)   Essential hypertension   GERD (gastroesophageal reflux disease)   Mixed hyperlipidemia   Pressure injury of skin     A/P 1)Acute Metabolic Encephalopathy-- suspect Dehydration and UTI related, - patient supposed to be on CPAP for OSA--episodes of confusion and agitation persist from time to time  ABG w/o hypercapnia -UDS unremarkable -Hypothermia has resolved   2)AEROCOCCUS UTI-----Treated with IV Rocephin, No further antibiotics needed   3)Dehydration and HyperNatremia--sodium is normalized with hydration Did well on D5 half-normal solution -- Ensure-or boost nutritional supplements twice daily   4)OSA--- patient supposed to be on CPAP for OSA--- ABG without hypercapnia -CPAP use at facility advised   5)Dementia with behavioral disturbance--- continue Namenda  -May use lorazepam as needed   6)Pancytopenia--WBC, Hgb and platelets are not far from baseline   7)H/o HTN--- BP is  soft, avoids BP meds at this time   8)Social/Ethics---Pt is a DNR/DNI,  Spoke with son Mark Solomon at [phone]   Disposition--discharge back to SNF   Disposition: The patient is from: SNF              Anticipated d/c is to: SN[facility]              Code Status :  -  Code Status: DNR    Family Communication:   -Spoke with son Mark Solomon at [phone] Consults  :  na  Discharge Condition: Stable  Follow UP--PCP at facility  Diet and Activity  recommendation:  As advised  Discharge Instructions    Discharge Instructions     Call MD for:  difficulty breathing, headache or visual disturbances   Complete by: As directed    Call MD for:  persistant nausea and vomiting   Complete by: As directed    Call MD for:  severe uncontrolled pain   Complete by: As directed    Call MD for:  temperature >100.4   Complete by: As directed    Diet general   Complete by: As directed    Discharge instructions   Complete by: As directed    1)Ensure or Boost Supplement twice daily  Discharge wound care:   Complete by: As directed    As advised   Increase activity slowly   Complete by: As directed         Discharge Medications     Allergies as of 12/17/2020       Reactions   Aricept [donepezil] Diarrhea        Medication List     STOP taking these medications    beclomethasone 40 MCG/ACT inhaler Commonly known as: QVAR   docusate sodium 50 MG capsule Commonly known as: COLACE   fluticasone 50 MCG/ACT nasal spray Commonly known as: FLONASE   ketoconazole 2 % shampoo Commonly known as: NIZORAL   losartan 50 MG tablet Commonly known as: COZAAR   naproxen sodium 220 MG tablet Commonly known as: ALEVE       TAKE these medications    folic acid 1 MG tablet Commonly known as: FOLVITE Take 1 mg by mouth daily.   LORazepam 0.5 MG tablet Commonly known as: Ativan Take 1 tablet (0.5 mg total) by mouth every 12 (twelve) hours as needed for anxiety or sleep.   memantine 10 MG tablet Commonly known as: NAMENDA TAKE 1 TABLET BY MOUTH TWICE DAILY What changed: when to take this   multivitamin with minerals tablet Take 1 tablet by mouth at bedtime.   pantoprazole 40 MG tablet Commonly known as: PROTONIX Take 1 tablet (40 mg total) by mouth daily. Start taking on: December 18, 2020 What changed:  medication strength how much to take   risperiDONE 1 MG tablet Commonly known as: RISPERDAL Take 1 mg by mouth 2  (two) times daily.   senna-docusate 8.6-50 MG tablet Commonly known as: Senokot-S Take 2 tablets by mouth at bedtime.   simvastatin 40 MG tablet Commonly known as: ZOCOR Take 40 mg by mouth at bedtime.   tamsulosin 0.4 MG Caps capsule Commonly known as: FLOMAX Take 0.4 mg by mouth at bedtime.   thiamine 100 MG tablet Take 100 mg by mouth daily.               Discharge Care Instructions  (From admission, onward)           Start     Ordered   12/17/20 0000  Discharge wound care:       Comments: As advised   12/17/20 1438            Major procedures and Radiology Reports - PLEASE review detailed and final reports for all details, in brief -   CT Head Wo Contrast  Result Date: 12/13/2020 CLINICAL DATA:  Encephalopathy EXAM: CT HEAD WITHOUT CONTRAST TECHNIQUE: Contiguous axial images were obtained from the base of the skull through the vertex without intravenous contrast. COMPARISON:  None. FINDINGS: Brain: There is no mass, hemorrhage or extra-axial collection. The appearance of the white matter is normal for the patient's age. There is generalized atrophy. Vascular: No abnormal hyperdensity of the major intracranial arteries or dural venous sinuses. No intracranial atherosclerosis. Skull: The visualized skull base, calvarium and extracranial soft tissues are normal. Sinuses/Orbits: No fluid levels or advanced mucosal thickening of the visualized paranasal sinuses. No mastoid or middle ear effusion. The orbits are normal. IMPRESSION: Generalized atrophy without acute intracranial abnormality. Electronically Signed   By: Deatra Robinson M.D.   On: 12/13/2020 20:04   DG Chest Port 1 View  Result Date: 12/13/2020 CLINICAL DATA:  Altered mental status EXAM: PORTABLE CHEST 1 VIEW COMPARISON:  09/04/2020 FINDINGS: The heart size and  mediastinal contours are within normal limits. Aortic atherosclerosis. Both lungs are clear. The visualized skeletal structures are unremarkable.  Emphysematous disease. Postsurgical changes at the humeral heads. IMPRESSION: No active disease.  Emphysematous disease. Electronically Signed   By: Jasmine Pang M.D.   On: 12/13/2020 19:31    Recent Results (from the past 240 hour(s))  Culture, blood (routine x 2)     Status: None (Preliminary result)   Collection Time: 12/13/20  7:12 PM   Specimen: Right Antecubital; Blood  Result Value Ref Range Status   Specimen Description   Final    RIGHT ANTECUBITAL BOTTLES DRAWN AEROBIC AND ANAEROBIC   Special Requests   Final    Blood Culture results may not be optimal due to an excessive volume of blood received in culture bottles   Culture   Final    NO GROWTH 4 DAYS Performed at [facility], [address] [place], [place] [address]    Report Status PENDING  Incomplete  Culture, blood (routine x 2)     Status: None (Preliminary result)   Collection Time: 12/13/20  7:13 PM   Specimen: Left Antecubital; Blood  Result Value Ref Range Status   Specimen Description LEFT ANTECUBITAL  Final   Special Requests   Final    BOTTLES DRAWN AEROBIC AND ANAEROBIC Blood Culture adequate volume   Culture   Final    NO GROWTH 4 DAYS Performed at [facility], [address], [place], [place] [address]    Report Status PENDING  Incomplete  Resp Panel by RT-PCR (Flu A&B, Covid) Nasopharyngeal Swab     Status: None   Collection Time: 12/13/20  8:43 PM   Specimen: Nasopharyngeal Swab; Nasopharyngeal(NP) swabs in vial transport medium  Result Value Ref Range Status   SARS Coronavirus 2 by RT PCR NEGATIVE NEGATIVE Final    Comment: (NOTE) SARS-CoV-2 target nucleic acids are NOT DETECTED.  The SARS-CoV-2 RNA is generally detectable in upper respiratory specimens during the acute phase of infection. The lowest concentration of SARS-CoV-2 viral copies this assay can detect is 138 copies/mL. A negative result does not preclude SARS-Cov-2 infection and should not be used as the sole basis for treatment  or other patient management decisions. A negative result may occur with  improper specimen collection/handling, submission of specimen other than nasopharyngeal swab, presence of viral mutation(s) within the areas targeted by this assay, and inadequate number of viral copies(<138 copies/mL). A negative result must be combined with clinical observations, patient history, and epidemiological information. The expected result is Negative.  Fact Sheet for Patients:  BloggerCourse.com  Fact Sheet for Healthcare Providers:  SeriousBroker.it  This test is no t yet approved or cleared by the [place] FDA and  has been authorized for detection and/or diagnosis of SARS-CoV-2 by FDA under an Emergency Use Authorization (EUA). This EUA will remain  in effect (meaning this test can be used) for the duration of the COVID-19 declaration under Section 564(b)(1) of the Act, 21 U.S.C.section 360bbb-3(b)(1), unless the authorization is terminated  or revoked sooner.       Influenza A by PCR NEGATIVE NEGATIVE Final   Influenza B by PCR NEGATIVE NEGATIVE Final    Comment: (NOTE) The Xpert Xpress SARS-CoV-2/FLU/RSV plus assay is intended as an aid in the diagnosis of influenza from Nasopharyngeal swab specimens and should not be used as a sole basis for treatment. Nasal washings and aspirates are unacceptable for Xpert Xpress SARS-CoV-2/FLU/RSV testing.  Fact Sheet for Patients: BloggerCourse.com  Fact  Sheet for Healthcare Providers: SeriousBroker.it  This test is not yet approved or cleared by the [place] and has been authorized for detection and/or diagnosis of SARS-CoV-2 by FDA under an Emergency Use Authorization (EUA). This EUA will remain in effect (meaning this test can be used) for the duration of the COVID-19 declaration under Section 564(b)(1) of the Act, 21 U.S.C. section  360bbb-3(b)(1), unless the authorization is terminated or revoked.  Performed at [facility], [address] [place], [place] [address]   Urine Culture     Status: Abnormal   Collection Time: 12/14/20 12:40 PM   Specimen: In/Out Cath Urine  Result Value Ref Range Status   Specimen Description   Final    IN/OUT CATH URINE Performed at [facility], [address] [place], [place] [address]    Special Requests   Final    NONE Performed at [facility], [address], [place], [place] [address]    Culture (A)  Final    20,000 COLONIES/mL AEROCOCCUS SPECIES Standardized susceptibility testing for this organism is not available. Performed at [facility], [address] [place], [place] [address]    Report Status 12/17/2020 FINAL  Final   Today   Subjective    [name] today has no new complaints        -No fevers, no emesis -I called and updated patient's son Mark Solomon at [phone]  Patient has been seen and examined prior to discharge   Objective   Blood pressure (!) 119/58, pulse 67, temperature 98.6 F (37 C), resp. rate 16, height 5\' 10"  (1.778 m), weight 69.1 kg, SpO2 99 %.   Intake/Output Summary (Last 24 hours) at 12/17/2020 1440 Last data filed at 12/17/2020 1300 Gross per 24 hour  Intake 3601.82 ml  Output 3000 ml  Net 601.82 ml   Exam Gen:- Awake Alert, no acute distress , more cooperative HEENT:- [place].AT, No sclera icterus Neck-Supple Neck,No JVD,.  Lungs-  CTAB , good air movement bilaterally  CV- S1, S2 normal, regular Abd-  +ve B.Sounds, Abd Soft, No tenderness,    Extremity/Skin:- No  edema,   good pulses Psych-baseline/underlying memory and cognitive deficits with on and off behavioral disturbance Neuro-Generalized weakness, no new focal deficits, no tremors    Data Review   CBC w Diff:  Lab Results  Component Value Date   WBC 3.2 (L) 12/17/2020   HGB 11.6 (L) 12/17/2020   HCT 36.4 (L) 12/17/2020   PLT 122 (L) 12/17/2020    LYMPHOPCT 26 12/13/2020   MONOPCT 8 12/13/2020   EOSPCT 4 12/13/2020   BASOPCT 0 12/13/2020    CMP:  Lab Results  Component Value Date   NA 141 12/16/2020   K 3.9 12/16/2020   CL 106 12/16/2020   CO2 29 12/16/2020   BUN 11 12/16/2020   CREATININE 0.66 12/16/2020   PROT 5.0 (L) 12/14/2020   ALBUMIN 2.8 (L) 12/14/2020   BILITOT 0.7 12/14/2020   ALKPHOS 90 12/14/2020   AST 17 12/14/2020   ALT 14 12/14/2020    Total Discharge time is about 33 minutes  14/03/2020 M.D on 12/17/2020 at 2:40 PM  Go to [email or web address] -  for contact info  [facility] - Office  [phone]

## 2020-12-18 LAB — CULTURE, BLOOD (ROUTINE X 2)
Culture: NO GROWTH
Culture: NO GROWTH
Special Requests: ADEQUATE

## 2020-12-31 ENCOUNTER — Other Ambulatory Visit: Payer: Self-pay

## 2020-12-31 ENCOUNTER — Emergency Department (HOSPITAL_COMMUNITY): Payer: Medicare Other

## 2020-12-31 ENCOUNTER — Emergency Department (HOSPITAL_COMMUNITY)
Admission: EM | Admit: 2020-12-31 | Discharge: 2020-12-31 | Disposition: A | Discharge status: Home / Self Care | Payer: Medicare Other | Attending: Emergency Medicine | Admitting: Emergency Medicine

## 2020-12-31 ENCOUNTER — Encounter (HOSPITAL_COMMUNITY): Payer: Self-pay | Admitting: Emergency Medicine

## 2020-12-31 DIAGNOSIS — Z79899 Other long term (current) drug therapy: Secondary | ICD-10-CM | POA: Insufficient documentation

## 2020-12-31 DIAGNOSIS — Z96651 Presence of right artificial knee joint: Secondary | ICD-10-CM | POA: Diagnosis not present

## 2020-12-31 DIAGNOSIS — F0390 Unspecified dementia without behavioral disturbance: Secondary | ICD-10-CM | POA: Insufficient documentation

## 2020-12-31 DIAGNOSIS — J449 Chronic obstructive pulmonary disease, unspecified: Secondary | ICD-10-CM | POA: Insufficient documentation

## 2020-12-31 DIAGNOSIS — R4182 Altered mental status, unspecified: Secondary | ICD-10-CM | POA: Diagnosis present

## 2020-12-31 DIAGNOSIS — Z87891 Personal history of nicotine dependence: Secondary | ICD-10-CM | POA: Diagnosis not present

## 2020-12-31 DIAGNOSIS — I10 Essential (primary) hypertension: Secondary | ICD-10-CM | POA: Insufficient documentation

## 2020-12-31 DIAGNOSIS — N3090 Cystitis, unspecified without hematuria: Secondary | ICD-10-CM

## 2020-12-31 LAB — CBC WITH DIFFERENTIAL/PLATELET
Abs Immature Granulocytes: 0.03 10*3/uL (ref 0.00–0.07)
Basophils Absolute: 0 10*3/uL (ref 0.0–0.1)
Basophils Relative: 0 %
Eosinophils Absolute: 0 10*3/uL (ref 0.0–0.5)
Eosinophils Relative: 0 %
HCT: 36.4 % — ABNORMAL LOW (ref 39.0–52.0)
Hemoglobin: 11.8 g/dL — ABNORMAL LOW (ref 13.0–17.0)
Immature Granulocytes: 0 %
Lymphocytes Relative: 7 %
Lymphs Abs: 0.5 10*3/uL — ABNORMAL LOW (ref 0.7–4.0)
MCH: 29.4 pg (ref 26.0–34.0)
MCHC: 32.4 g/dL (ref 30.0–36.0)
MCV: 90.8 fL (ref 80.0–100.0)
Monocytes Absolute: 0.6 10*3/uL (ref 0.1–1.0)
Monocytes Relative: 8 %
Neutro Abs: 6 10*3/uL (ref 1.7–7.7)
Neutrophils Relative %: 85 %
Platelets: 136 10*3/uL — ABNORMAL LOW (ref 150–400)
RBC: 4.01 MIL/uL — ABNORMAL LOW (ref 4.22–5.81)
RDW: 14.8 % (ref 11.5–15.5)
WBC: 7.1 10*3/uL (ref 4.0–10.5)
nRBC: 0 % (ref 0.0–0.2)

## 2020-12-31 LAB — COMPREHENSIVE METABOLIC PANEL
ALT: 11 U/L (ref 0–44)
AST: 15 U/L (ref 15–41)
Albumin: 3.3 g/dL — ABNORMAL LOW (ref 3.5–5.0)
Alkaline Phosphatase: 103 U/L (ref 38–126)
Anion gap: 8 (ref 5–15)
BUN: 28 mg/dL — ABNORMAL HIGH (ref 8–23)
CO2: 28 mmol/L (ref 22–32)
Calcium: 8.9 mg/dL (ref 8.9–10.3)
Chloride: 102 mmol/L (ref 98–111)
Creatinine, Ser: 0.87 mg/dL (ref 0.61–1.24)
GFR, Estimated: >60 mL/min (ref >60)
Glucose, Bld: 119 mg/dL — ABNORMAL HIGH (ref 70–99)
Potassium: 3.7 mmol/L (ref 3.5–5.1)
Sodium: 138 mmol/L (ref 135–145)
Total Bilirubin: 0.8 mg/dL (ref 0.3–1.2)
Total Protein: 6.7 g/dL (ref 6.5–8.1)

## 2020-12-31 LAB — URINALYSIS, ROUTINE W REFLEX MICROSCOPIC
Bilirubin Urine: NEGATIVE
Glucose, UA: NEGATIVE mg/dL
Ketones, ur: NEGATIVE mg/dL
Nitrite: POSITIVE — AB
Protein, ur: 30 mg/dL — AB
Specific Gravity, Urine: 1.02 (ref 1.005–1.030)
pH: 5.5 (ref 5.0–8.0)

## 2020-12-31 LAB — URINALYSIS, MICROSCOPIC (REFLEX): WBC, UA: >50 WBC/hpf (ref 0–5)

## 2020-12-31 LAB — LACTIC ACID, PLASMA: Lactic Acid, Venous: 1.1 mmol/L (ref 0.5–1.9)

## 2020-12-31 MED ORDER — CEPHALEXIN 500 MG PO CAPS: 500.0000 mg | ORAL_CAPSULE | Freq: Four times a day (QID) | ORAL | 0 refills | Status: AC

## 2020-12-31 MED ORDER — SODIUM CHLORIDE 0.9 % IV BOLUS
1000.0000 mL | Freq: Once | INTRAVENOUS | Status: AC
  Administered 2020-12-31: 22:00:00 1000 mL via INTRAVENOUS

## 2020-12-31 MED ORDER — SODIUM CHLORIDE 0.9 % IV SOLN
1.0000 g | Freq: Once | INTRAVENOUS | Status: AC
  Administered 2020-12-31: 22:00:00 1 g via INTRAVENOUS
  Filled 2020-12-31: qty 10

## 2020-12-31 MED ORDER — CEPHALEXIN 500 MG PO CAPS: 500.0000 mg | ORAL_CAPSULE | Freq: Four times a day (QID) | ORAL | 0 refills | Status: DC

## 2020-12-31 NOTE — ED Triage Notes (Signed)
Pt brought in by [facility] from [facility]. According to staff pt has been alerted for several days but today has been almost unresponsive. Hx of dementia.

## 2020-12-31 NOTE — Discharge Instructions (Addendum)
Take keflex 4x daily for the next 5 days.

## 2020-12-31 NOTE — ED Provider Notes (Signed)
[facility] EMERGENCY DEPARTMENT Provider Note   CSN: [id number] Arrival date & time: 12/31/20  1851     History Chief Complaint  Patient presents with   Altered Mental Status    Mark Solomon is a 80 y.o. male.  HPI  Patient with history of hypertension, COPD, apnea, hyperlipidemia presents due to altered mental status.  Level 5 caveat applies secondary to mental state.  Patient has history of dementia and lives at [facility].  Per the staff he has been acting off for the last 2 days, they state he was "unresponsive" earlier today.  Patient is gauging with staff, does not answer questions secondary to mental status.  Collateral information obtained from chart review and from speaking to the patient's wife via the phone.  Patient's wife saw him earlier this afternoon, he was not acting like himself.  Reports he was very tired, not very talkative.  States that the home was trying to get a urine sample, patient's wife was unaware he was brought to the hospital.  Past Medical History:  Diagnosis Date   Aortic valve sclerosis    Arthritis    COPD (chronic obstructive pulmonary disease) (HCC)    Diastolic dysfunction    Hyperlipidemia    Hypertension    Kidney stone    Sleep apnea     Patient Active Problem List   Diagnosis Date Noted   Altered mental status 12/14/2020   Hypothermia 12/14/2020   Normocytic anemia 12/14/2020   COPD (chronic obstructive pulmonary disease) (HCC) 12/14/2020   Essential hypertension 12/14/2020   GERD (gastroesophageal reflux disease) 12/14/2020   Mixed hyperlipidemia 12/14/2020   BPH (benign prostatic hyperplasia) 12/14/2020   Pressure injury of skin 12/14/2020   Acute metabolic encephalopathy 12/14/2020   Subdural hematoma due to concussion 02/07/2019   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, 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   Cognitive decline 02/07/2019   Portal hypertensive gastropathy (HCC) 02/07/2019   Thrombocytopenia (HCC)  02/07/2019   Esophageal dysphagia 12/27/2017   OSA on CPAP 09/15/2017   Abnormal dreams 09/15/2017   Obstructive sleep apnea hypopnea, severe 09/15/2017   Cachexia (HCC) 09/15/2017   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   Snoring 09/15/2017   Primary localized osteoarthritis of right knee 02/08/2015    Past Surgical History:  Procedure Laterality Date   CARPAL TUNNEL RELEASE Bilateral    CATARACT EXTRACTION Bilateral    COLONOSCOPY W/ BIOPSIES AND POLYPECTOMY     ESOPHAGEAL DILATION N/A 01/07/2018   Procedure: ESOPHAGEAL DILATION;  Surgeon: Malissa Hippo, MD;  Location: [facility];  Service: Endoscopy;  Laterality: N/A;   ESOPHAGOGASTRODUODENOSCOPY (EGD) WITH PROPOFOL N/A 01/07/2018   Procedure: ESOPHAGOGASTRODUODENOSCOPY (EGD) WITH PROPOFOL;  Surgeon: Malissa Hippo, MD;  Location: [facility];  Service: Endoscopy;  Laterality: N/A;  10:15   HAMMER TOE SURGERY     JOINT REPLACEMENT     KNEE ARTHROSCOPY W/ MENISCAL REPAIR     right    MULTIPLE TOOTH EXTRACTIONS     NM MYOCAR PERF WALL MOTION  11/14/2008   Normal   REPLACEMENT TOTAL KNEE  1994   left   ROTATOR CUFF REPAIR Bilateral approx 20 years   Geoffery Spruce    TOTAL KNEE ARTHROPLASTY Right 02/08/2015   Procedure: TOTAL KNEE ARTHROPLASTY;  Surgeon: Frederico Hamman, MD;  Location: [facility] OR;  Service: Orthopedics;  Laterality: Right;   US ECHOCARDIOGRAPHY  01/23/10   AOV appears sclerotic, borderline LA enlargement  Family History  Problem Relation Age of Onset   Diabetes Mother    Heart attack Mother    Heart attack Father     Social History   Tobacco Use   Smoking status: Former    Packs/day: 1.00    Years: 50.00    Pack years: 50.00    Types: Cigarettes    Quit date: 01/13/2011    Years since quitting: 9.9   Smokeless tobacco: Never  Vaping Use   Vaping Use: Never used  Substance Use Topics   Alcohol use: Yes    Alcohol/week: 14.0 standard drinks    Types: 14 Cans of beer per week    Drug use: No    Home Medications Prior to Admission medications   Medication Sig Start Date End Date Taking? Authorizing Provider  folic acid (FOLVITE) 1 MG tablet Take 1 mg by mouth daily.    [provider]  LORazepam (ATIVAN) 0.5 MG tablet Take 1 tablet (0.5 mg total) by mouth every 12 (twelve) hours as needed for anxiety or sleep. 12/17/20 12/17/21  Shon Hale, MD  memantine (NAMENDA) 10 MG tablet TAKE 1 TABLET BY MOUTH TWICE DAILY Patient taking differently: Take 10 mg by mouth daily. 11/25/20   Butch Penny, NP  Multiple Vitamins-Minerals (MULTIVITAMIN WITH MINERALS) tablet Take 1 tablet by mouth at bedtime.     [provider]  pantoprazole (PROTONIX) 40 MG tablet Take 1 tablet (40 mg total) by mouth daily. 12/18/20   Shon Hale, MD  risperiDONE (RISPERDAL) 1 MG tablet Take 1 mg by mouth 2 (two) times daily.    [provider]  senna-docusate (SENOKOT-S) 8.6-50 MG tablet Take 2 tablets by mouth at bedtime. 12/17/20 12/17/21  Shon Hale, MD  simvastatin (ZOCOR) 40 MG tablet Take 40 mg by mouth at bedtime.     [provider]  tamsulosin (FLOMAX) 0.4 MG CAPS capsule Take 0.4 mg by mouth at bedtime.     [provider]  thiamine 100 MG tablet Take 100 mg by mouth daily.    [provider]    Allergies    Aricept [donepezil]  Review of Systems   Review of Systems  Unable to perform ROS: Dementia   Physical Exam Updated Vital Signs BP 134/78 (BP Location: Right Arm)    Pulse 98    Temp 99.6 F (37.6 C) (Rectal)    Resp 14    Ht 5\' 10"  (1.778 m)    Wt 68.2 kg    SpO2 100%    BMI 21.57 kg/m   Physical Exam Vitals and nursing note reviewed. Exam conducted with a chaperone present.  Constitutional:      Appearance: Normal appearance.  HENT:     Head: Normocephalic and atraumatic.  Eyes:     General: No scleral icterus.       Right eye: No discharge.        Left eye: No discharge.     Extraocular  Movements: Extraocular movements intact.     Pupils: Pupils are equal, round, and reactive to light.  Cardiovascular:     Rate and Rhythm: Normal rate and regular rhythm.     Pulses: Normal pulses.     Heart sounds: Normal heart sounds. No murmur heard.   No friction rub. No gallop.     Comments: Heart rate elevated but not tachycardic, radial pulse 2+ equal bilaterally Pulmonary:     Effort: Pulmonary effort is normal. No respiratory distress.     Breath  sounds: Normal breath sounds.  Abdominal:     General: Abdomen is flat. Bowel sounds are normal. There is no distension.     Palpations: Abdomen is soft.     Tenderness: There is no abdominal tenderness.     Comments: Abdomen is soft, not particularly tender  Musculoskeletal:        General: No swelling.  Skin:    General: Skin is warm and dry.     Coloration: Skin is not jaundiced.     Comments: No decubitus ulcer noted  Neurological:     Mental Status: He is alert. He is disoriented.     Coordination: Coordination normal.   ED Results / Procedures / Treatments   Labs (all labs ordered are listed, but only abnormal results are displayed) Labs Reviewed - No data to display  EKG None  Radiology No results found.  Procedures Procedures   Medications Ordered in ED Medications - No data to display  ED Course  I have reviewed the triage vital signs and the nursing notes.  Pertinent labs & imaging results that were available during my care of the patient were reviewed by me and considered in my medical decision making (see chart for details).    MDM Rules/Calculators/A&P                         Patient rectal temp is 99.6, heart rate is been elevated but was not initially tachycardic or febrile.  Technically not septic we will check a lactic and blood culture given borderline.  Patient has a UTI, suspect this is likely the source of his altered mental status.  Culture sent out, will give initial dose of Rocephin here  and will discharge with Keflex.  Patient does not have a significant leukocytosis, no gross electrolyte derangement.  Not septic.  Do not think he needs admission at this time, do not think repeat head CT warranted given the sources most likely the UTI.  Additionally he has not had any recent falls or hit his head.  Spoke with the patient's wife on the phone who is agreeable with the plan.  We will plan on Rocephin and then discharge.  Discussed HPI, physical exam and plan of care for this patient with attending Dr. Cathren Laine. The attending physician evaluated this patient as part of a shared visit and agrees with plan of care.     Final Clinical Impression(s) / ED Diagnoses Final diagnoses:  None    Rx / DC Orders ED Discharge Orders     None        Theron Arista, Cordelia Poche 12/31/20 2243    Cathren Laine, MD 01/01/21 1038

## 2021-01-03 LAB — URINE CULTURE: Culture: >=100000 — AB

## 2021-01-04 ENCOUNTER — Telehealth: Payer: Self-pay | Admitting: Emergency Medicine

## 2021-01-04 NOTE — Telephone Encounter (Signed)
Post ED Visit - Positive Culture Follow-up: Successful Patient Follow-Up  Culture assessed and recommendations reviewed by:  []  , Pharm.D. []  Enzo Bi, Pharm.D., BCPS AQ-ID []  , Pharm.D., BCPS []  Celedonio Miyamoto, Pharm.D., BCPS []  [place], Garvin Fila.D., BCPS, AAHIVP []  , Pharm.D., BCPS, AAHIVP []  Georgina Pillion, PharmD, BCPS []  , PharmD, BCPS []  [place], PharmD, BCPS [x]  [address], PharmD  Positive urine culture  []  Patient discharged without antimicrobial prescription and treatment is now indicated [x]  Organism is resistant to prescribed ED discharge antimicrobial []  Patient with positive blood cultures  Changes discussed with ED provider: PA Called and faxed to [facility] Fax [phone] [facility]  [phone]  Contacted patient's SNF date 01/04/2021, time 1300   [place] Staffa C [place] 01/04/2021, 2:28 PM

## 2021-01-06 LAB — CULTURE, BLOOD (ROUTINE X 2)
Culture: NO GROWTH
Culture: NO GROWTH
Special Requests: ADEQUATE
Special Requests: ADEQUATE

## 2022-10-27 IMAGING — CT CT HEAD W/O CM
3 series · 16 of 47 positions shown, 19 images · non-contrast
Comparison: None.

CLINICAL DATA: Encephalopathy

EXAM:
CT HEAD WITHOUT CONTRAST
TECHNIQUE: Contiguous axial images were obtained from the base of the skull
through the vertex without intravenous contrast.

[Series 2: head w o · axial · 0.44mm/px · z∈[-62,+63]mm · 10 of 31 slices shown, 13 images]
[im 3/31  brain]
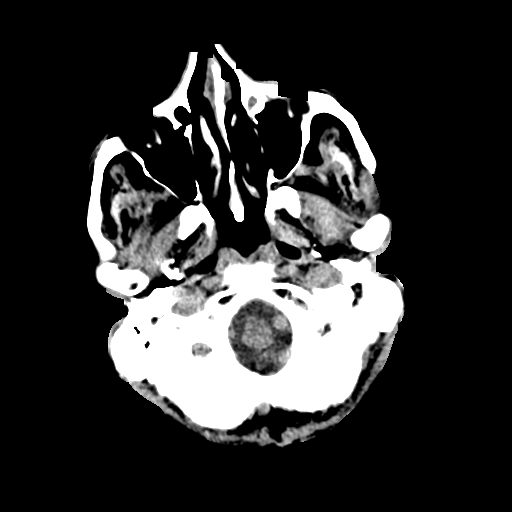
[im 3/31  bone]
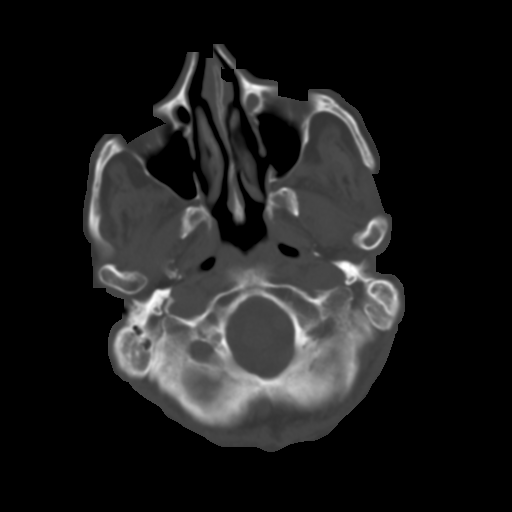
[im 6/31  brain]
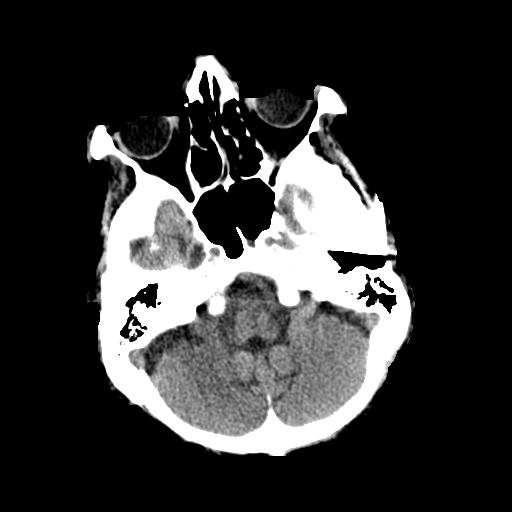
[im 9/31  brain]
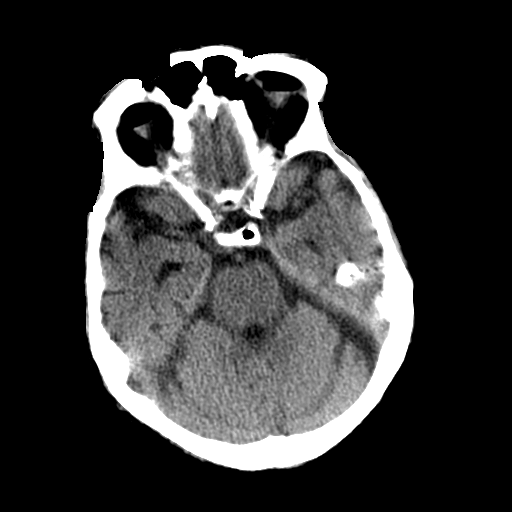
[im 11/31  brain]
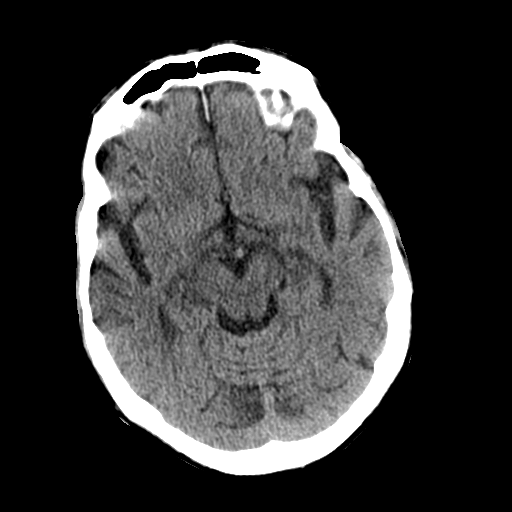
[im 14/31  brain]
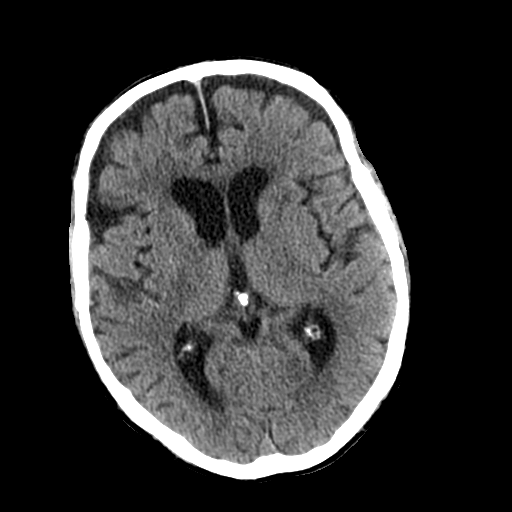
[im 14/31  bone]
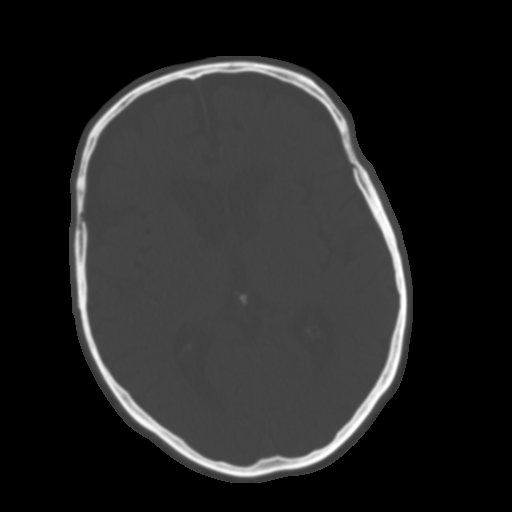
[im 17/31  brain]
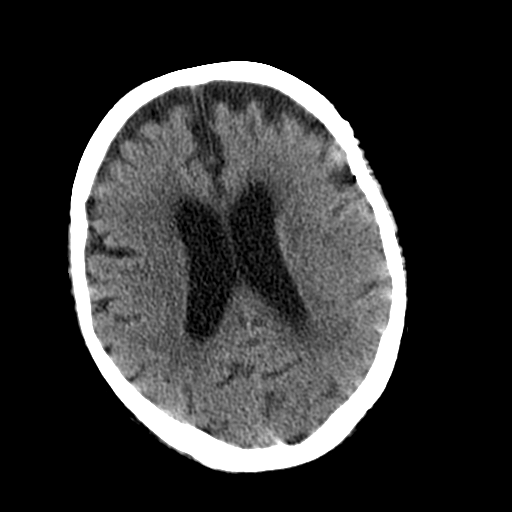
[im 20/31  brain]
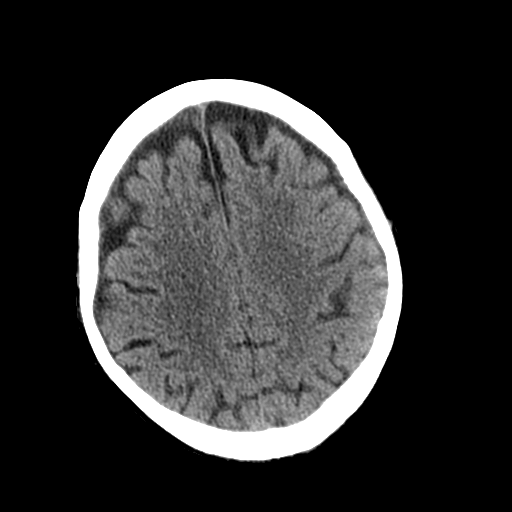
[im 23/31  brain]
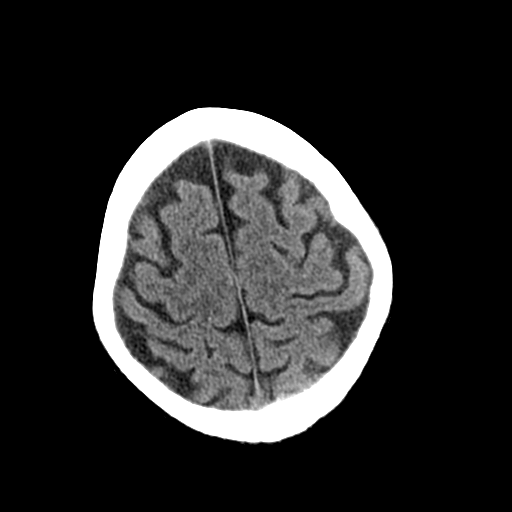
[im 25/31  brain]
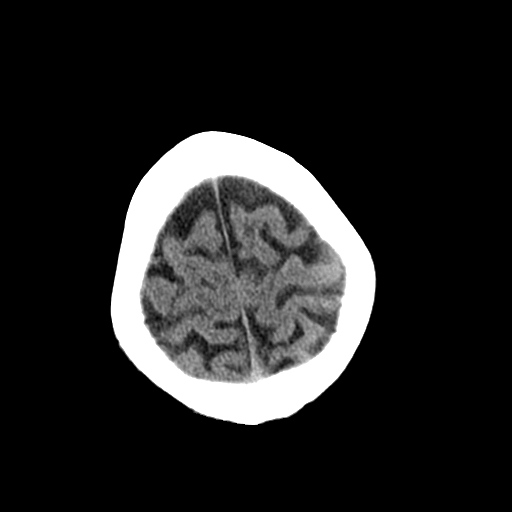
[im 25/31  bone]
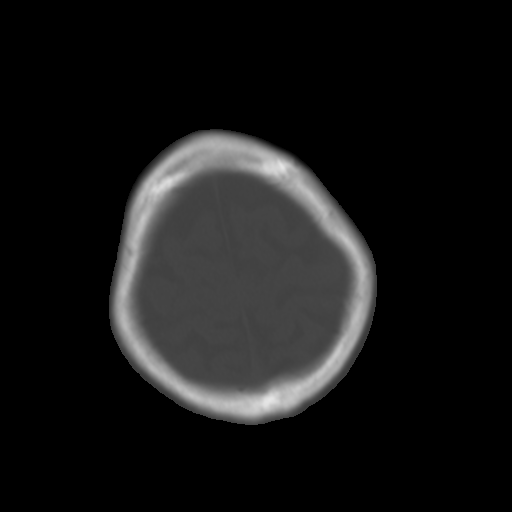
[im 28/31  brain]
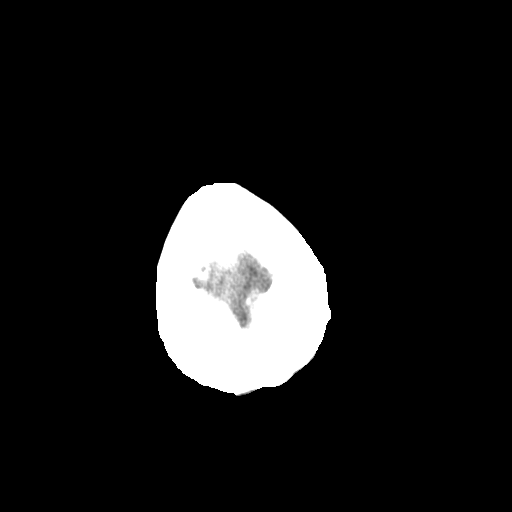

[Series 4: coronal soft · coronal · 0.35mm/px · 3 of 76 slices shown]
[im 26/76  brain]
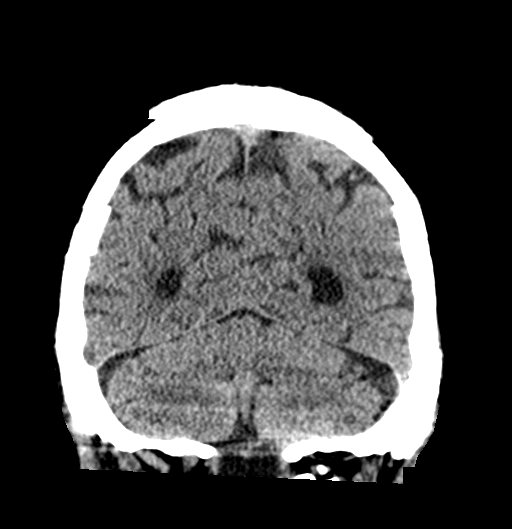
[im 34/76  brain]
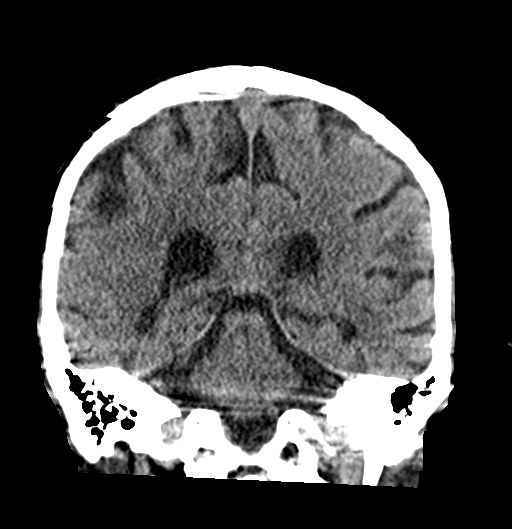
[im 42/76  brain]
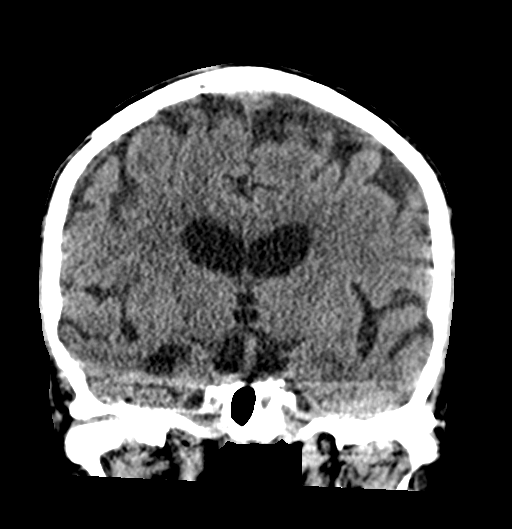

[Series 5: sagittal soft · sagittal · 0.35mm/px · 3 of 57 slices shown]
[im 19/57  brain]
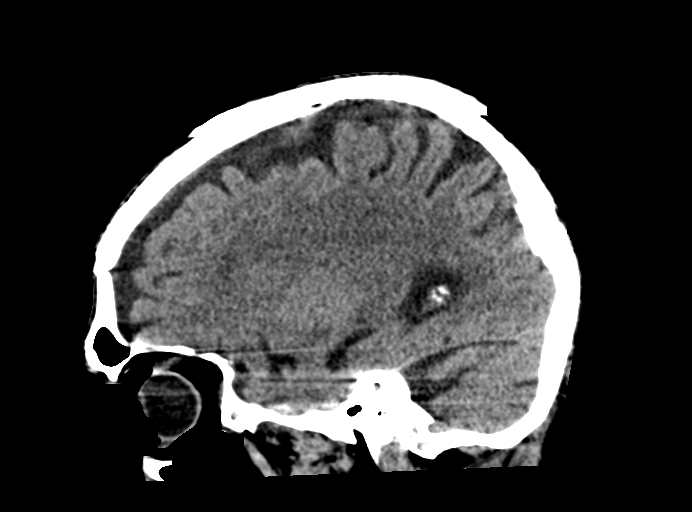
[im 29/57  brain]
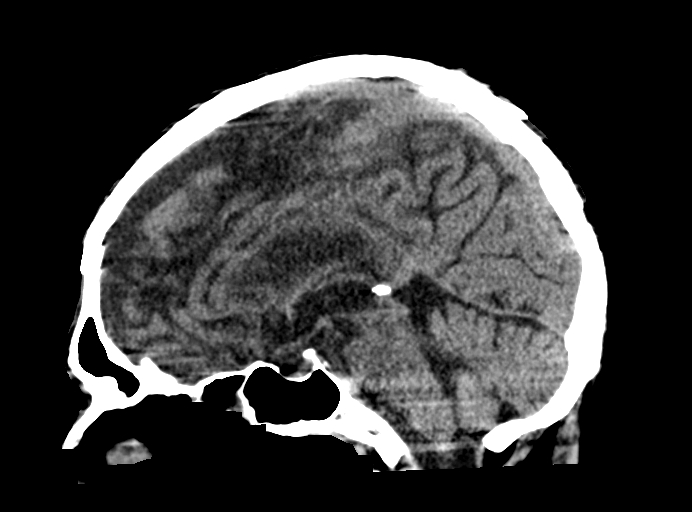
[im 38/57  brain]
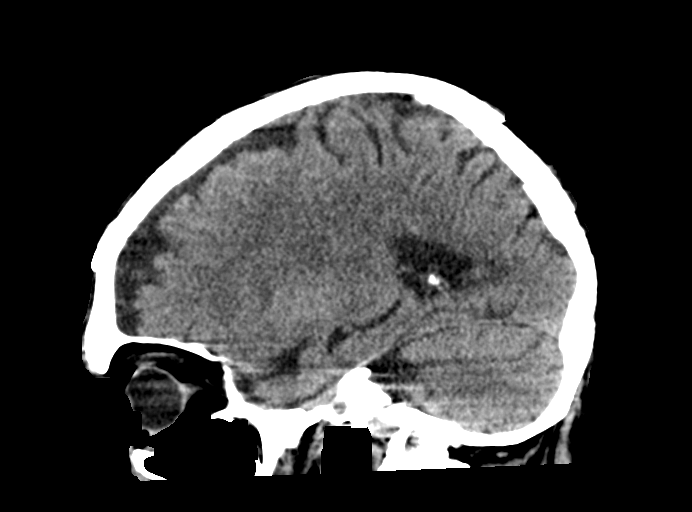

[16 of 47 positions shown; findings below may reference images not displayed]

FINDINGS: Brain: There is no mass, hemorrhage or extra-axial collection. The
appearance of the white matter is normal for the patient's age.
There is generalized atrophy.

Vascular: No abnormal hyperdensity of the major intracranial
arteries or dural venous sinuses. No intracranial atherosclerosis.

Skull: The visualized skull base, calvarium and extracranial soft
tissues are normal.

Sinuses/Orbits: No fluid levels or advanced mucosal thickening of
the visualized paranasal sinuses. No mastoid or middle ear effusion.
The orbits are normal.
IMPRESSION: Generalized atrophy without acute intracranial abnormality.

## 2022-10-27 IMAGING — DX DG CHEST 1V PORT
1 series · 1 of 1 positions shown · non-contrast
Comparison: 09/04/2020

CLINICAL DATA: Altered mental status

EXAM:
PORTABLE CHEST 1 VIEW

[chest ap]
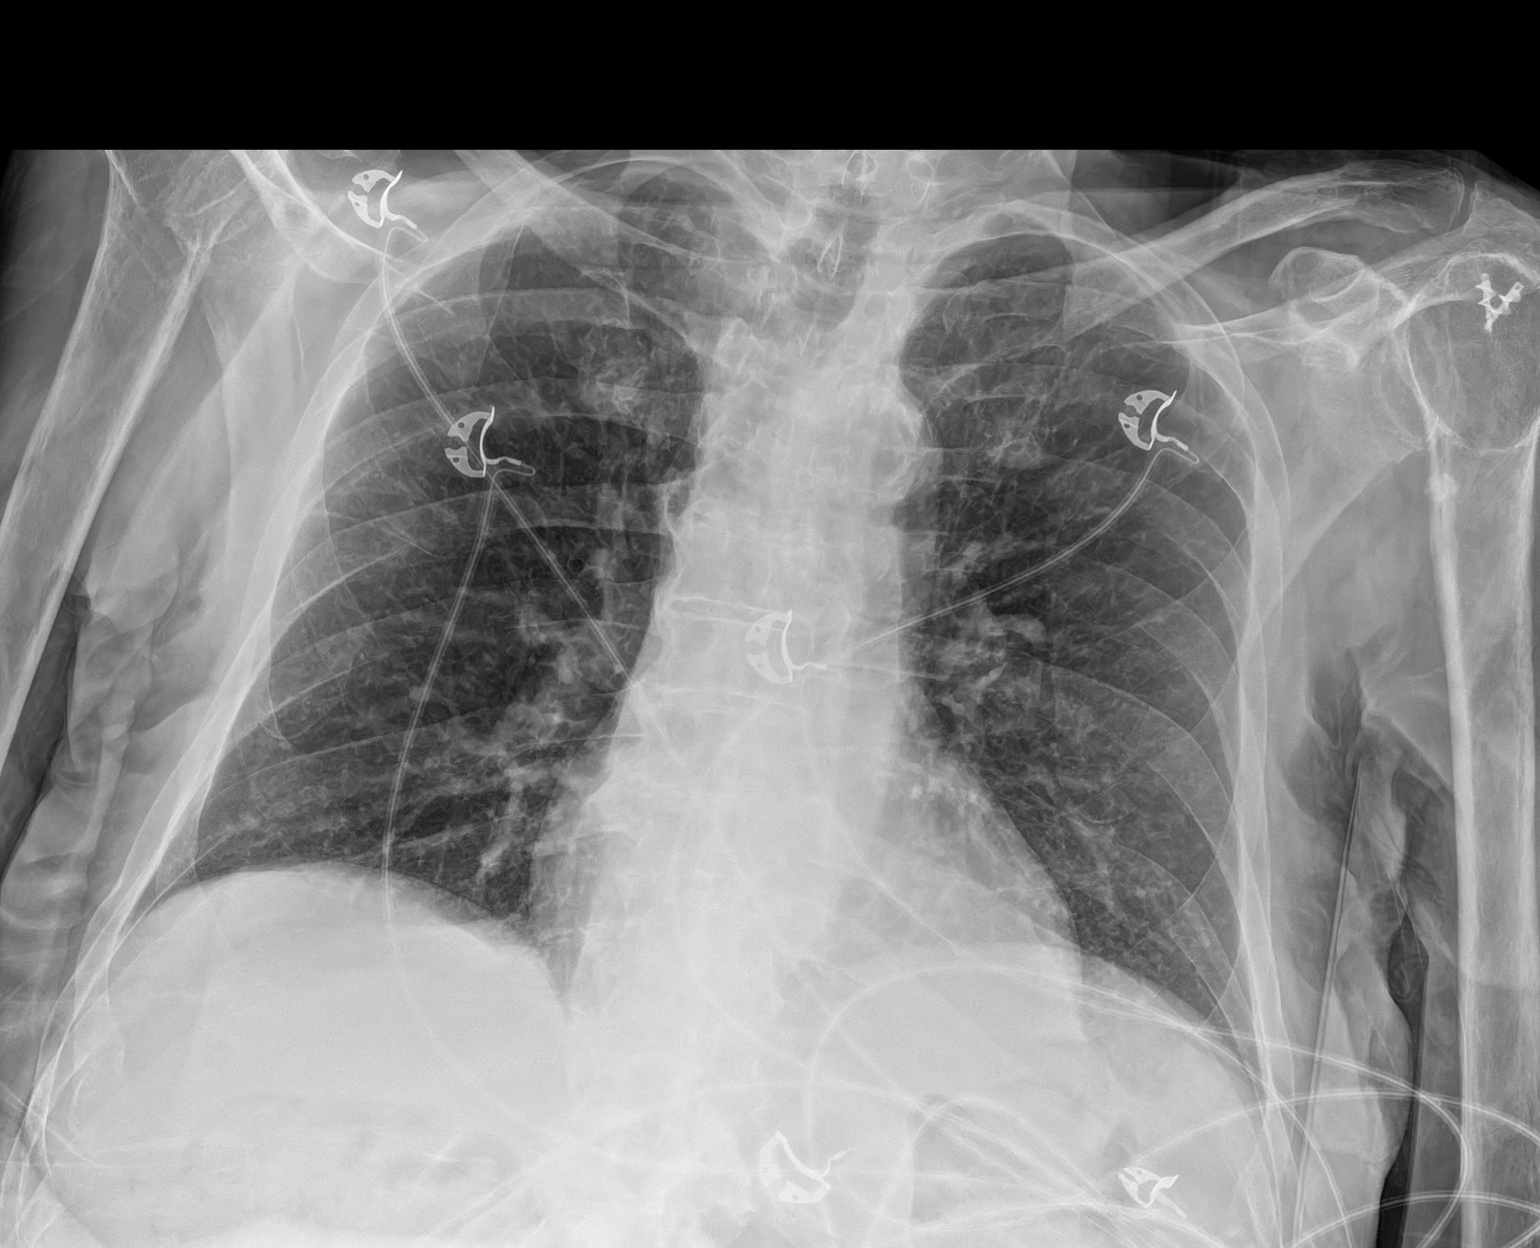

[1 of 1 positions shown; findings below may reference images not displayed]

FINDINGS: The heart size and mediastinal contours are within normal limits.
Aortic atherosclerosis. Both lungs are clear. The visualized
skeletal structures are unremarkable. Emphysematous disease.
Postsurgical changes at the humeral heads.
IMPRESSION: No active disease.  Emphysematous disease.
